# Patient Record
Sex: Female | Born: 1990 | Race: White | Hispanic: No | Marital: Married | Smoking: Never smoker
Health system: Southern US, Community
[De-identification: ages and names within clinical notes are randomized; demographics above are authoritative.]

## PROBLEM LIST (undated history)

## (undated) DIAGNOSIS — E88819 Insulin resistance, unspecified: Secondary | ICD-10-CM

## (undated) DIAGNOSIS — E669 Obesity, unspecified: Secondary | ICD-10-CM

## (undated) DIAGNOSIS — R0602 Shortness of breath: Secondary | ICD-10-CM

## (undated) DIAGNOSIS — L680 Hirsutism: Secondary | ICD-10-CM

## (undated) DIAGNOSIS — E8881 Metabolic syndrome: Secondary | ICD-10-CM

## (undated) DIAGNOSIS — M2550 Pain in unspecified joint: Secondary | ICD-10-CM

## (undated) DIAGNOSIS — E282 Polycystic ovarian syndrome: Secondary | ICD-10-CM

## (undated) DIAGNOSIS — S8991XA Unspecified injury of right lower leg, initial encounter: Secondary | ICD-10-CM

## (undated) DIAGNOSIS — E281 Androgen excess: Secondary | ICD-10-CM

## (undated) DIAGNOSIS — S83209A Unspecified tear of unspecified meniscus, current injury, unspecified knee, initial encounter: Secondary | ICD-10-CM

## (undated) DIAGNOSIS — J45909 Unspecified asthma, uncomplicated: Secondary | ICD-10-CM

## (undated) HISTORY — DX: Unspecified tear of unspecified meniscus, current injury, unspecified knee, initial encounter: S83.209A

## (undated) HISTORY — DX: Androgen excess: E28.1

## (undated) HISTORY — DX: Polycystic ovarian syndrome: E28.2

## (undated) HISTORY — DX: Shortness of breath: R06.02

## (undated) HISTORY — PX: OTHER SURGICAL HISTORY: SHX169

## (undated) HISTORY — DX: Unspecified injury of right lower leg, initial encounter: S89.91XA

## (undated) HISTORY — DX: Metabolic syndrome: E88.81

## (undated) HISTORY — DX: Insulin resistance, unspecified: E88.819

## (undated) HISTORY — DX: Obesity, unspecified: E66.9

## (undated) HISTORY — DX: Pain in unspecified joint: M25.50

## (undated) HISTORY — DX: Hirsutism: L68.0

## (undated) HISTORY — DX: Unspecified asthma, uncomplicated: J45.909

---

## 2016-02-09 HISTORY — PX: OTHER SURGICAL HISTORY: SHX169

## 2016-05-24 DIAGNOSIS — L42 Pityriasis rosea: Secondary | ICD-10-CM | POA: Diagnosis not present

## 2016-05-24 DIAGNOSIS — R635 Abnormal weight gain: Secondary | ICD-10-CM | POA: Diagnosis not present

## 2016-05-25 DIAGNOSIS — E559 Vitamin D deficiency, unspecified: Secondary | ICD-10-CM | POA: Diagnosis not present

## 2016-05-25 DIAGNOSIS — Z6839 Body mass index (BMI) 39.0-39.9, adult: Secondary | ICD-10-CM | POA: Diagnosis not present

## 2016-05-25 DIAGNOSIS — R635 Abnormal weight gain: Secondary | ICD-10-CM | POA: Diagnosis not present

## 2016-05-25 DIAGNOSIS — L818 Other specified disorders of pigmentation: Secondary | ICD-10-CM | POA: Diagnosis not present

## 2016-06-22 DIAGNOSIS — Z6838 Body mass index (BMI) 38.0-38.9, adult: Secondary | ICD-10-CM | POA: Diagnosis not present

## 2016-06-22 DIAGNOSIS — E669 Obesity, unspecified: Secondary | ICD-10-CM | POA: Diagnosis not present

## 2016-07-22 DIAGNOSIS — B360 Pityriasis versicolor: Secondary | ICD-10-CM | POA: Diagnosis not present

## 2016-08-10 DIAGNOSIS — Z6839 Body mass index (BMI) 39.0-39.9, adult: Secondary | ICD-10-CM | POA: Diagnosis not present

## 2016-08-10 DIAGNOSIS — E669 Obesity, unspecified: Secondary | ICD-10-CM | POA: Diagnosis not present

## 2016-08-10 DIAGNOSIS — M25561 Pain in right knee: Secondary | ICD-10-CM | POA: Diagnosis not present

## 2016-08-10 DIAGNOSIS — Z23 Encounter for immunization: Secondary | ICD-10-CM | POA: Diagnosis not present

## 2017-04-19 DIAGNOSIS — Z0000 Encounter for general adult medical examination without abnormal findings: Secondary | ICD-10-CM | POA: Diagnosis not present

## 2017-06-01 DIAGNOSIS — Z3040 Encounter for surveillance of contraceptives, unspecified: Secondary | ICD-10-CM | POA: Diagnosis not present

## 2017-06-01 DIAGNOSIS — Z6839 Body mass index (BMI) 39.0-39.9, adult: Secondary | ICD-10-CM | POA: Diagnosis not present

## 2017-06-01 DIAGNOSIS — Z01419 Encounter for gynecological examination (general) (routine) without abnormal findings: Secondary | ICD-10-CM | POA: Diagnosis not present

## 2017-07-05 DIAGNOSIS — Z30430 Encounter for insertion of intrauterine contraceptive device: Secondary | ICD-10-CM | POA: Diagnosis not present

## 2018-07-03 DIAGNOSIS — Z6841 Body Mass Index (BMI) 40.0 and over, adult: Secondary | ICD-10-CM | POA: Diagnosis not present

## 2018-07-03 DIAGNOSIS — Z3040 Encounter for surveillance of contraceptives, unspecified: Secondary | ICD-10-CM | POA: Diagnosis not present

## 2018-07-03 DIAGNOSIS — Z01419 Encounter for gynecological examination (general) (routine) without abnormal findings: Secondary | ICD-10-CM | POA: Diagnosis not present

## 2018-07-03 DIAGNOSIS — Z124 Encounter for screening for malignant neoplasm of cervix: Secondary | ICD-10-CM | POA: Diagnosis not present

## 2018-12-28 ENCOUNTER — Other Ambulatory Visit: Payer: Self-pay | Admitting: Occupational Medicine

## 2018-12-28 ENCOUNTER — Other Ambulatory Visit: Payer: Self-pay

## 2018-12-28 ENCOUNTER — Ambulatory Visit: Payer: Self-pay

## 2018-12-28 DIAGNOSIS — M79642 Pain in left hand: Secondary | ICD-10-CM

## 2018-12-28 MED ORDER — HYDROCODONE-ACETAMINOPHEN 5-325 MG PO TABS: 2.0000 | ORAL_TABLET | Freq: Four times a day (QID) | ORAL | 0 refills | Status: AC | PRN

## 2019-08-07 DIAGNOSIS — Z3040 Encounter for surveillance of contraceptives, unspecified: Secondary | ICD-10-CM | POA: Diagnosis not present

## 2019-08-07 DIAGNOSIS — Z01419 Encounter for gynecological examination (general) (routine) without abnormal findings: Secondary | ICD-10-CM | POA: Diagnosis not present

## 2019-08-07 DIAGNOSIS — Z6841 Body Mass Index (BMI) 40.0 and over, adult: Secondary | ICD-10-CM | POA: Diagnosis not present

## 2019-08-30 DIAGNOSIS — L918 Other hypertrophic disorders of the skin: Secondary | ICD-10-CM | POA: Diagnosis not present

## 2019-08-30 DIAGNOSIS — A630 Anogenital (venereal) warts: Secondary | ICD-10-CM | POA: Diagnosis not present

## 2020-08-06 DIAGNOSIS — R635 Abnormal weight gain: Secondary | ICD-10-CM | POA: Diagnosis not present

## 2020-08-06 DIAGNOSIS — Z3040 Encounter for surveillance of contraceptives, unspecified: Secondary | ICD-10-CM | POA: Diagnosis not present

## 2020-08-06 DIAGNOSIS — Z01419 Encounter for gynecological examination (general) (routine) without abnormal findings: Secondary | ICD-10-CM | POA: Diagnosis not present

## 2020-08-06 DIAGNOSIS — L679 Hair color and hair shaft abnormality, unspecified: Secondary | ICD-10-CM | POA: Diagnosis not present

## 2020-08-06 DIAGNOSIS — Z6841 Body Mass Index (BMI) 40.0 and over, adult: Secondary | ICD-10-CM | POA: Diagnosis not present

## 2020-08-13 ENCOUNTER — Encounter (INDEPENDENT_AMBULATORY_CARE_PROVIDER_SITE_OTHER): Payer: Self-pay

## 2020-08-19 DIAGNOSIS — E559 Vitamin D deficiency, unspecified: Secondary | ICD-10-CM | POA: Diagnosis not present

## 2020-08-19 DIAGNOSIS — E281 Androgen excess: Secondary | ICD-10-CM | POA: Diagnosis not present

## 2020-08-20 ENCOUNTER — Other Ambulatory Visit: Payer: Self-pay | Admitting: Obstetrics and Gynecology

## 2020-08-20 DIAGNOSIS — E281 Androgen excess: Secondary | ICD-10-CM

## 2020-08-20 DIAGNOSIS — E559 Vitamin D deficiency, unspecified: Secondary | ICD-10-CM | POA: Diagnosis not present

## 2020-08-29 ENCOUNTER — Ambulatory Visit
Admission: RE | Admit: 2020-08-29 | Discharge: 2020-08-29 | Disposition: A | Discharge status: Home / Self Care | Payer: BC Managed Care – PPO | Source: Ambulatory Visit | Attending: Obstetrics and Gynecology | Admitting: Obstetrics and Gynecology

## 2020-08-29 ENCOUNTER — Other Ambulatory Visit: Payer: Self-pay

## 2020-08-29 DIAGNOSIS — E281 Androgen excess: Secondary | ICD-10-CM

## 2020-08-29 DIAGNOSIS — K314 Gastric diverticulum: Secondary | ICD-10-CM | POA: Diagnosis not present

## 2020-08-29 DIAGNOSIS — R635 Abnormal weight gain: Secondary | ICD-10-CM | POA: Diagnosis not present

## 2020-08-29 MED ORDER — IOPAMIDOL (ISOVUE-300) INJECTION 61%
100.0000 mL | Freq: Once | INTRAVENOUS | Status: AC | PRN
  Administered 2020-08-29: 100 mL via INTRAVENOUS

## 2020-09-17 DIAGNOSIS — E281 Androgen excess: Secondary | ICD-10-CM | POA: Diagnosis not present

## 2020-09-23 DIAGNOSIS — E281 Androgen excess: Secondary | ICD-10-CM | POA: Diagnosis not present

## 2020-09-30 DIAGNOSIS — E559 Vitamin D deficiency, unspecified: Secondary | ICD-10-CM | POA: Diagnosis not present

## 2020-09-30 DIAGNOSIS — E669 Obesity, unspecified: Secondary | ICD-10-CM | POA: Diagnosis not present

## 2020-09-30 DIAGNOSIS — L680 Hirsutism: Secondary | ICD-10-CM | POA: Diagnosis not present

## 2020-10-06 DIAGNOSIS — L680 Hirsutism: Secondary | ICD-10-CM | POA: Diagnosis not present

## 2020-10-22 DIAGNOSIS — Z6841 Body Mass Index (BMI) 40.0 and over, adult: Secondary | ICD-10-CM | POA: Diagnosis not present

## 2020-10-22 DIAGNOSIS — N970 Female infertility associated with anovulation: Secondary | ICD-10-CM | POA: Diagnosis not present

## 2020-10-22 IMAGING — DX LEFT HAND - COMPLETE 3+ VIEW
3 series · 3 of 3 positions shown · non-contrast
Comparison: None.

CLINICAL DATA: 27-year-old female status post crush injury to the
left hand which was caught in a machine earlier today. Bruising and
mild lacerations most significant along the thumb.

EXAM:
LEFT HAND - COMPLETE 3+ VIEW

[hand pa]
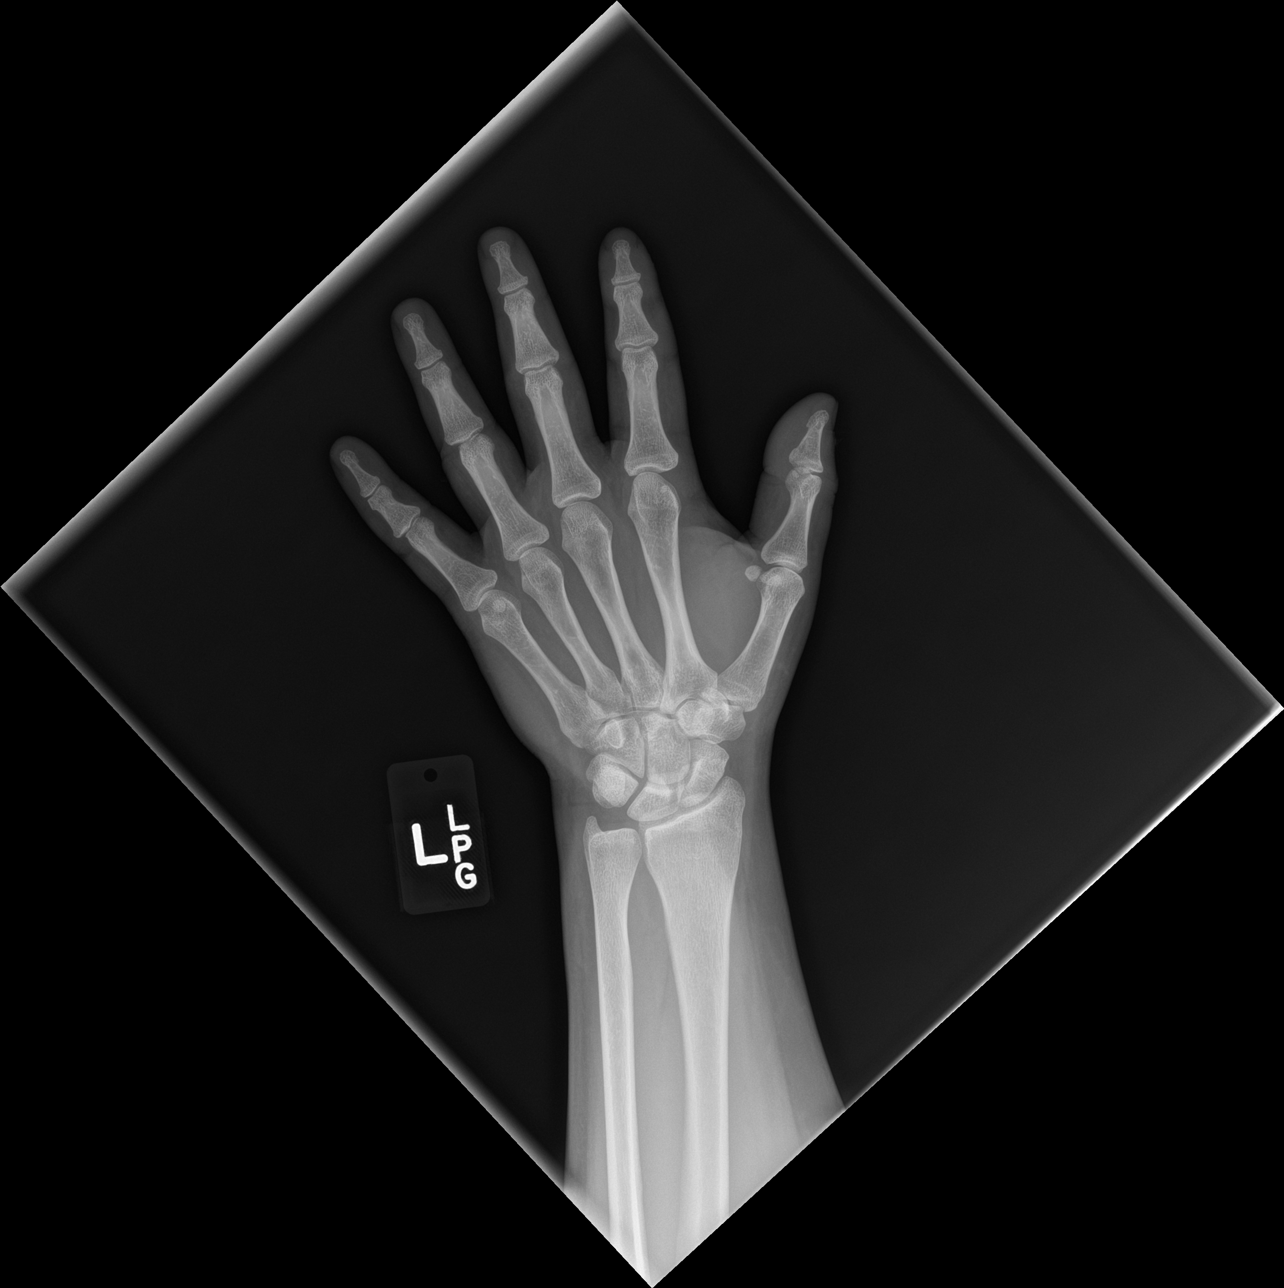

[hand obl]
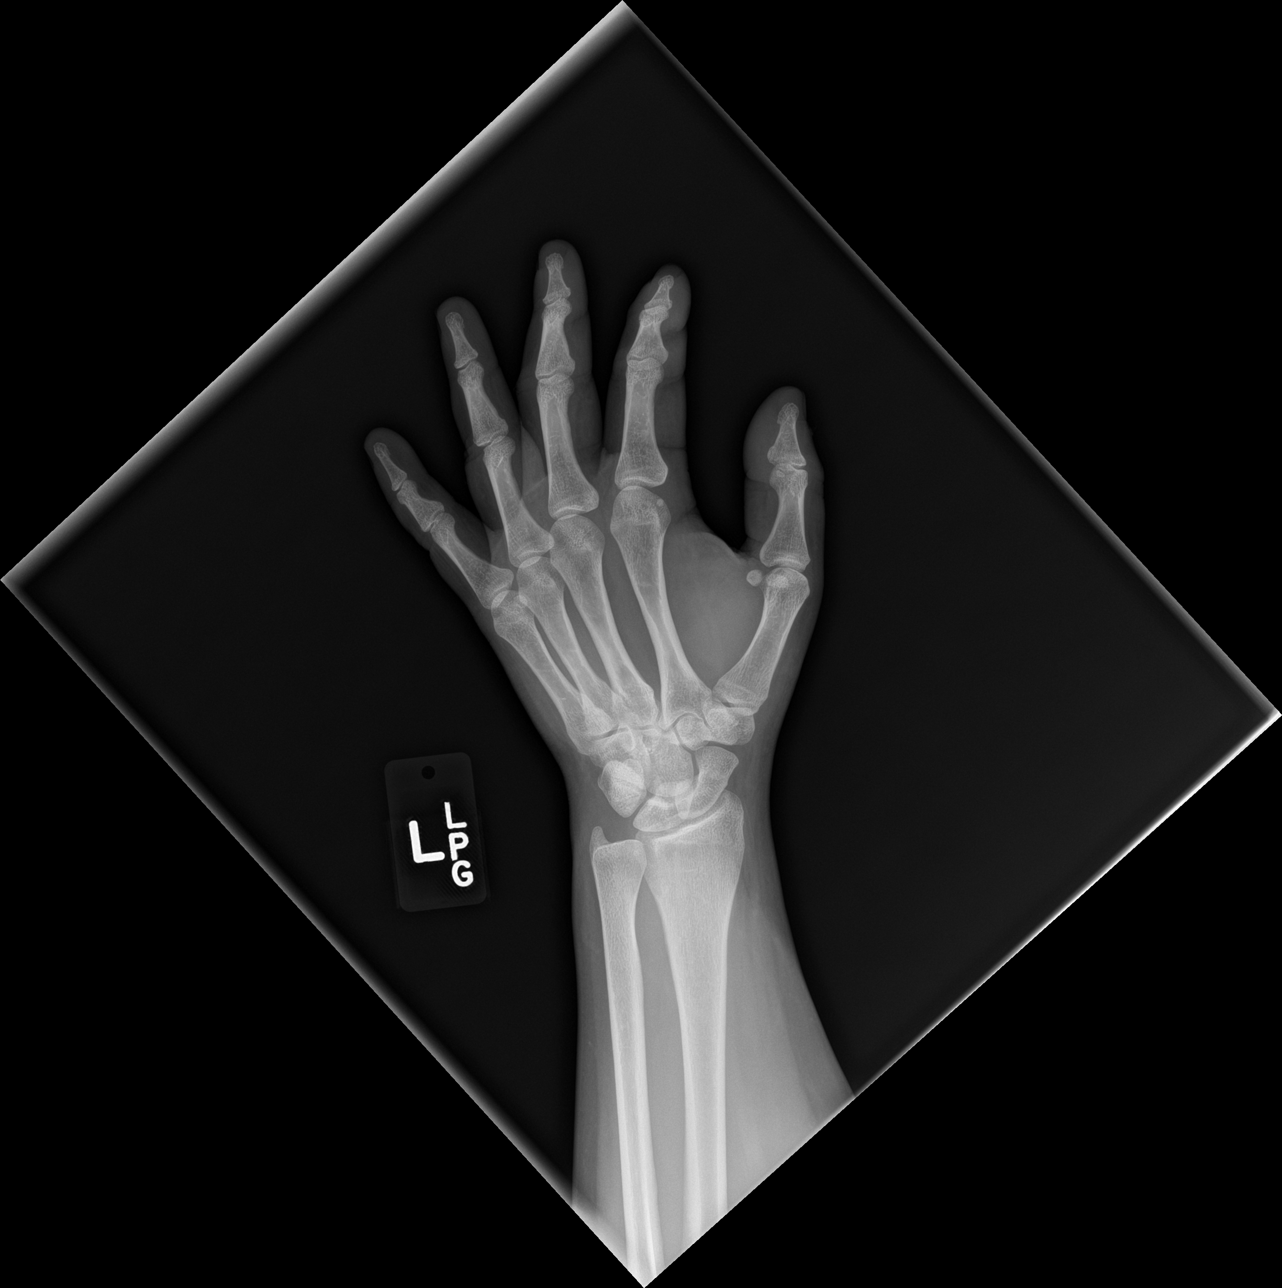

[hand lat]
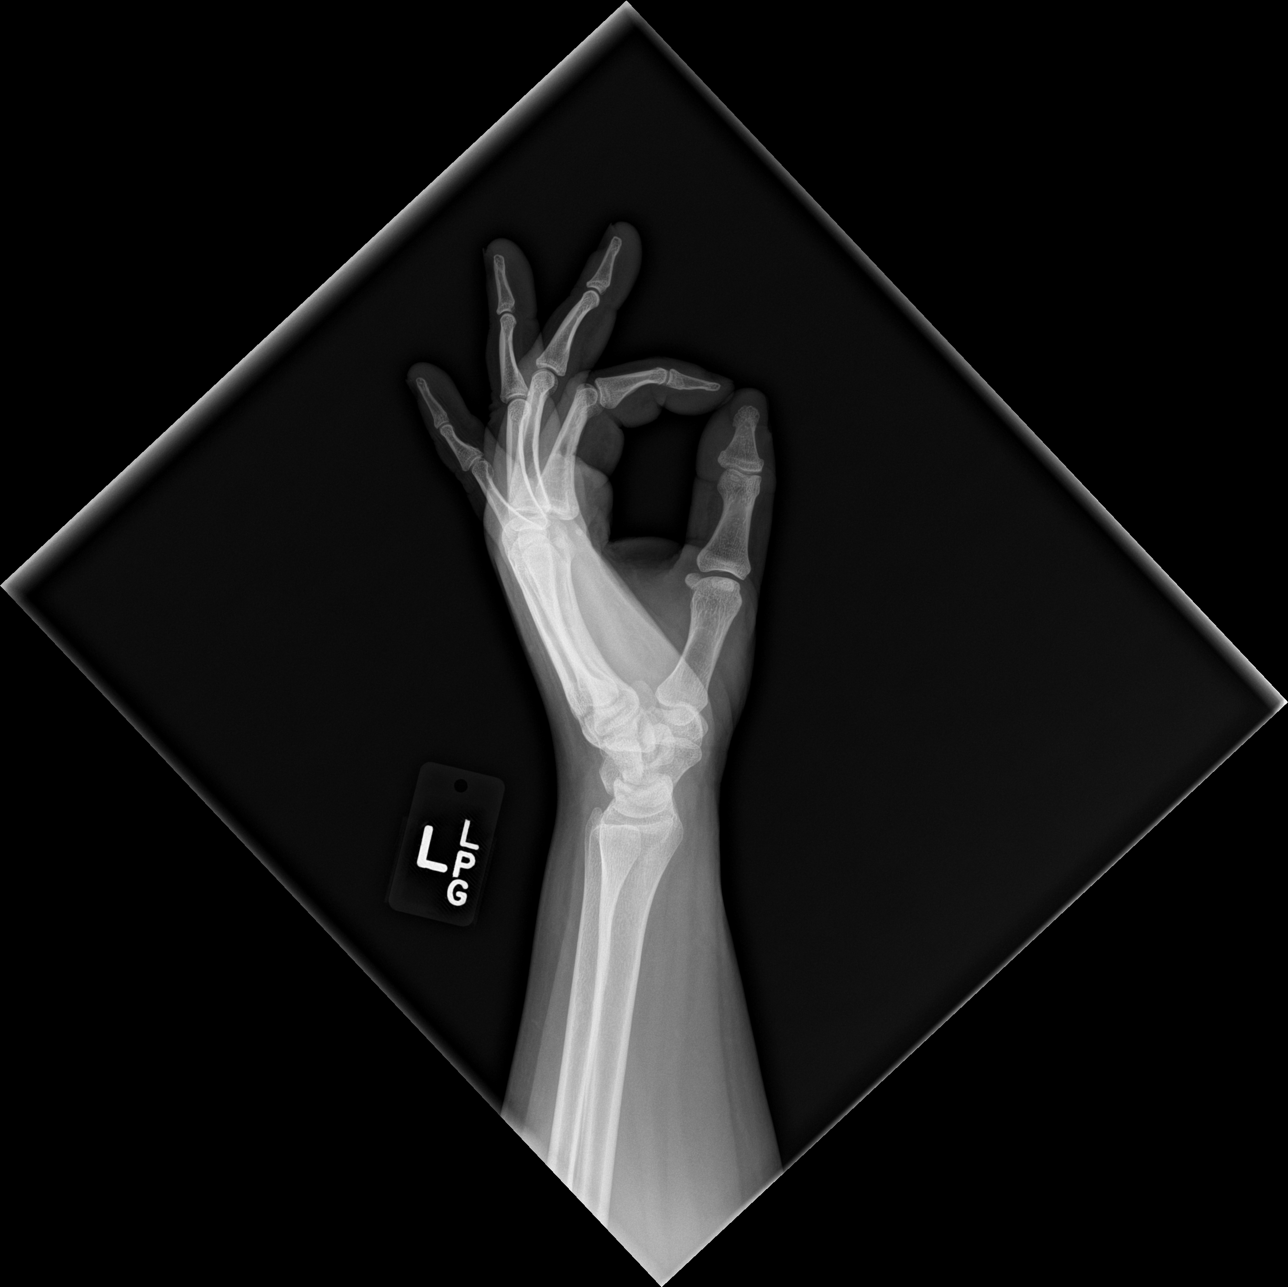

[3 of 3 positions shown; findings below may reference images not displayed]

FINDINGS: There is no evidence of fracture or dislocation. There is no
evidence of arthropathy or other focal bone abnormality. Multifocal
mild regions of soft tissue irregularity consistent with the
clinical history of bruising and superficial lacerations. No
retained radiopaque foreign body.
IMPRESSION: Negative.

## 2020-11-09 ENCOUNTER — Encounter (INDEPENDENT_AMBULATORY_CARE_PROVIDER_SITE_OTHER): Payer: Self-pay

## 2020-11-19 ENCOUNTER — Encounter (INDEPENDENT_AMBULATORY_CARE_PROVIDER_SITE_OTHER): Payer: Self-pay | Admitting: Family Medicine

## 2020-11-19 ENCOUNTER — Ambulatory Visit (INDEPENDENT_AMBULATORY_CARE_PROVIDER_SITE_OTHER): Payer: BC Managed Care – PPO | Admitting: Family Medicine

## 2020-11-19 ENCOUNTER — Other Ambulatory Visit: Payer: Self-pay

## 2020-11-19 VITALS — BP 124/75 | HR 87 | Temp 97.8°F | Ht 65.0 in | Wt 238.0 lb

## 2020-11-19 DIAGNOSIS — E8881 Metabolic syndrome: Secondary | ICD-10-CM | POA: Diagnosis not present

## 2020-11-19 DIAGNOSIS — Z6839 Body mass index (BMI) 39.0-39.9, adult: Secondary | ICD-10-CM

## 2020-11-19 DIAGNOSIS — E559 Vitamin D deficiency, unspecified: Secondary | ICD-10-CM

## 2020-11-19 DIAGNOSIS — Z1331 Encounter for screening for depression: Secondary | ICD-10-CM

## 2020-11-19 DIAGNOSIS — R0602 Shortness of breath: Secondary | ICD-10-CM

## 2020-11-19 DIAGNOSIS — R5383 Other fatigue: Secondary | ICD-10-CM | POA: Diagnosis not present

## 2020-11-19 DIAGNOSIS — Z9189 Other specified personal risk factors, not elsewhere classified: Secondary | ICD-10-CM | POA: Diagnosis not present

## 2020-11-19 DIAGNOSIS — Z0289 Encounter for other administrative examinations: Secondary | ICD-10-CM

## 2020-11-20 LAB — T3: T3, Total: 131 ng/dL (ref 71–180)

## 2020-11-20 LAB — CBC WITH DIFFERENTIAL/PLATELET
Basophils Absolute: 0.1 10*3/uL (ref 0.0–0.2)
Basos: 1 %
EOS (ABSOLUTE): 0.2 10*3/uL (ref 0.0–0.4)
Eos: 2 %
Hematocrit: 42.7 % (ref 34.0–46.6)
Hemoglobin: 13.9 g/dL (ref 11.1–15.9)
Immature Grans (Abs): 0 10*3/uL (ref 0.0–0.1)
Immature Granulocytes: 0 %
Lymphocytes Absolute: 2.6 10*3/uL (ref 0.7–3.1)
Lymphs: 24 %
MCH: 29.8 pg (ref 26.6–33.0)
MCHC: 32.6 g/dL (ref 31.5–35.7)
MCV: 91 fL (ref 79–97)
Monocytes Absolute: 0.5 10*3/uL (ref 0.1–0.9)
Monocytes: 5 %
Neutrophils Absolute: 7.3 10*3/uL — ABNORMAL HIGH (ref 1.4–7.0)
Neutrophils: 68 %
Platelets: 358 10*3/uL (ref 150–450)
RBC: 4.67 x10E6/uL (ref 3.77–5.28)
RDW: 12.5 % (ref 11.7–15.4)
WBC: 10.8 10*3/uL (ref 3.4–10.8)

## 2020-11-20 LAB — COMPREHENSIVE METABOLIC PANEL
ALT: 27 IU/L (ref 0–32)
AST: 21 IU/L (ref 0–40)
Albumin/Globulin Ratio: 2 (ref 1.2–2.2)
Albumin: 4.7 g/dL (ref 3.9–5.0)
Alkaline Phosphatase: 78 IU/L (ref 44–121)
BUN/Creatinine Ratio: 17 (ref 9–23)
BUN: 14 mg/dL (ref 6–20)
Bilirubin Total: 0.6 mg/dL (ref 0.0–1.2)
CO2: 22 mmol/L (ref 20–29)
Calcium: 10.1 mg/dL (ref 8.7–10.2)
Chloride: 105 mmol/L (ref 96–106)
Creatinine, Ser: 0.84 mg/dL (ref 0.57–1.00)
GFR calc Af Amer: 110 mL/min/{1.73_m2} (ref >59)
GFR calc non Af Amer: 96 mL/min/{1.73_m2} (ref >59)
Globulin, Total: 2.4 g/dL (ref 1.5–4.5)
Glucose: 91 mg/dL (ref 65–99)
Potassium: 5 mmol/L (ref 3.5–5.2)
Sodium: 142 mmol/L (ref 134–144)
Total Protein: 7.1 g/dL (ref 6.0–8.5)

## 2020-11-20 LAB — LIPID PANEL WITH LDL/HDL RATIO
Cholesterol, Total: 152 mg/dL (ref 100–199)
HDL: 50 mg/dL (ref >39)
LDL Chol Calc (NIH): 83 mg/dL (ref 0–99)
LDL/HDL Ratio: 1.7 ratio (ref 0.0–3.2)
Triglycerides: 102 mg/dL (ref 0–149)
VLDL Cholesterol Cal: 19 mg/dL (ref 5–40)

## 2020-11-20 LAB — HEMOGLOBIN A1C
Est. average glucose Bld gHb Est-mCnc: 105 mg/dL
Hgb A1c MFr Bld: 5.3 % (ref 4.8–5.6)

## 2020-11-20 LAB — VITAMIN B12: Vitamin B-12: 561 pg/mL (ref 232–1245)

## 2020-11-20 LAB — VITAMIN D 25 HYDROXY (VIT D DEFICIENCY, FRACTURES): Vit D, 25-Hydroxy: 53.4 ng/mL (ref 30.0–100.0)

## 2020-11-20 LAB — FOLATE: Folate: 16.7 ng/mL (ref >3.0)

## 2020-11-20 LAB — T4: T4, Total: 8.8 ug/dL (ref 4.5–12.0)

## 2020-11-20 LAB — TSH: TSH: 1.69 u[IU]/mL (ref 0.450–4.500)

## 2020-11-21 NOTE — Progress Notes (Signed)
Dear Dr. Dois Davenport Rivard,   Thank you for referring Stephanie Huerta to our clinic. The following note includes my evaluation and treatment recommendations.  Chief Complaint:   OBESITY Stephanie Huerta (MR# [id number]) is a 30 y.o. female who presents for evaluation and treatment of obesity and related comorbidities. Current BMI is Body mass index is 39.61 kg/m. Stephanie Huerta has been struggling with her weight for many years and has been unsuccessful in either losing weight, maintaining weight loss, or reaching her healthy weight goal.  Stephanie Huerta is currently in the action stage of change and ready to dedicate time achieving and maintaining a healthier weight. Stephanie Huerta is interested in becoming our patient and working on intensive lifestyle modifications including (but not limited to) diet and exercise for weight loss.  Stephanie Huerta was referred by Dr. Estanislado Pandy. She does Keto shakes in the morning around 7 AM (feels satisfied); around 10 AM is yogurt cups and 1 oz portion almonds (sometimes hungry but sometimes convenience); 3 PM- starting to get hungry and has another shake; dinner- steak or salmon (4.5 oz) and vegetables (1/3 cup).  Stephanie Huerta's habits were reviewed today and are as follows: Her family eats meals together, she thinks her family will eat healthier with her, she struggles with family and or coworkers weight loss sabotage, her desired weight loss is 58 lbs, she has been heavy most of her life, she started gaining weight 5 years ago, her heaviest weight ever was 262 pounds, she has significant food cravings issues, she skips meals frequently, she is frequently drinking liquids with calories, she frequently makes poor food choices, she frequently eats larger portions than normal, she has binge eating behaviors and she struggles with emotional eating.  Depression Screen Stephanie Huerta (modified PHQ-9) score was 18.  Depression screen PHQ 2/9 11/19/2020  Decreased Interest 3  Down, Depressed,  Hopeless 2  PHQ - 2 Score 5  Altered sleeping 2  Tired, decreased energy 3  Change in appetite 2  Feeling bad or failure about yourself  3  Trouble concentrating 1  Moving slowly or fidgety/restless 1  Suicidal thoughts 1  PHQ-9 Score 18  Difficult doing work/chores Somewhat difficult   Subjective:   1. Other fatigue Stephanie Huerta admits to daytime somnolence and admits to waking up still tired. Patent has a history of symptoms of daytime fatigue. Mylia generally gets 6.5-8 hours of sleep per night, and states that she has generally restful sleep. Snoring is present. Apneic episodes are not present. Epworth Sleepiness Score is 2. EKG normal sinus rhythm at 94 bpm  2. SOB (shortness of breath) on exertion Stephanie Huerta notes increasing shortness of breath with exercising and seems to be worsening over time with weight gain. She notes getting out of breath sooner with activity than she used to. This has gotten worse recently. Jeriyah denies shortness of breath at rest or orthopnea. EKG normal sinus rhythm at 94 bpm  3. Insulin resistance Stephanie Huerta is on Metformin. She sees Dr. Talmage Nap. Her last insulin level was 40.1 at the end of December 2021.  4. Vitamin D deficiency Pt denies nausea, vomiting, and muscle weakness but notes fatigue. Pt is on prescription Vit D. Last labs in 20's.  5. At risk for diabetes mellitus Stephanie Huerta is at higher than average risk for developing diabetes due to obesity.   Assessment/Plan:   1. Other fatigue Stephanie Huerta does feel that her weight is causing her energy to be lower than it should be. Fatigue may be related  to obesity, depression or many other causes. Labs will be ordered, and in the meanwhile, Kandise will focus on self care including making healthy food choices, increasing physical activity and focusing on stress reduction.  - EKG 12-Lead - Vitamin B12 - T3 - T4 - TSH - Folate - CBC with Differential/Platelet - Lipid Panel With LDL/HDL Ratio  2. SOB (shortness of breath) on  exertion Stephanie Huerta does feel that she gets out of breath more easily that she used to when she exercises. Michal's shortness of breath appears to be obesity related and exercise induced. She has agreed to work on weight loss and gradually increase exercise to treat her exercise induced shortness of breath. Will continue to monitor closely.  3. Insulin resistance Stephanie Huerta will continue to work on weight loss, exercise, and decreasing simple carbohydrates to help decrease the risk of diabetes. Stephanie Huerta agreed to follow-up with [place] as directed to closely monitor her progress. Check labs today.  - Comprehensive metabolic panel - Hemoglobin A1c  4. Vitamin D deficiency Low Vitamin D level contributes to fatigue and are associated with obesity, breast, and colon cancer. She agrees to continue to take prescription Vitamin D @50 ,000 IU every week and will follow-up for routine testing of Vitamin D, at least 2-3 times per year to avoid over-replacement.  - VITAMIN D 25 Hydroxy (Vit-D Deficiency, Fractures)  5. Depression screening Stephanie Huerta had a positive depression screening. Depression is commonly associated with obesity and often results in emotional eating behaviors. We will monitor this closely and work on CBT to help improve the non-hunger eating patterns. Referral to Psychology may be required if no improvement is seen as she continues in our clinic.  6. At risk for diabetes mellitus Stephanie Huerta was given approximately 15 minutes of diabetes education and counseling today. We discussed intensive lifestyle modifications today with an emphasis on weight loss as well as increasing exercise and decreasing simple carbohydrates in her diet. We also reviewed medication options with an emphasis on risk versus benefit of those discussed.   Repetitive spaced learning was employed today to elicit superior memory formation and behavioral change.  7. Class 2 severe obesity with serious comorbidity and body mass index (BMI) of 39.0 to  39.9 in adult, unspecified obesity type (HCC) Stephanie Huerta is currently in the action stage of change and her goal is to continue with weight loss efforts. I recommend Stephanie Huerta begin the structured treatment plan as follows:  She has agreed to the Category 3 Plan + 100 calories.  Exercise goals: No exercise has been prescribed at this time.   Behavioral modification strategies: increasing lean protein intake, meal planning and cooking strategies, keeping healthy foods in the home and planning for success.  She was informed of the importance of frequent follow-up visits to maximize her success with intensive lifestyle modifications for her multiple health conditions. She was informed we would discuss her lab results at her next visit unless there is a critical issue that needs to be addressed sooner. Stephanie Huerta agreed to keep her next visit at the agreed upon time to discuss these results.  Objective:   Blood pressure 124/75, pulse 87, temperature 97.8 F (36.6 C), temperature source Oral, height 5\' 5"  (1.651 m), weight 238 lb (108 kg), last menstrual period 06/11/2020, SpO2 99 %. Body mass index is 39.61 kg/m.  EKG: Normal sinus rhythm, rate 94.  Indirect Calorimeter completed today shows a VO2 of 299 and a REE of 2076.  Her calculated basal metabolic rate is 08/11/2020 thus her  basal metabolic rate is better than expected.  General: Cooperative, alert, well developed, in no acute distress. HEENT: Conjunctivae and lids unremarkable. Cardiovascular: Regular rhythm.  Lungs: Normal work of breathing. Neurologic: No focal deficits.   Lab Results  Component Value Date   CREATININE 0.84 11/19/2020   BUN 14 11/19/2020   NA 142 11/19/2020   K 5.0 11/19/2020   CL 105 11/19/2020   CO2 22 11/19/2020   Lab Results  Component Value Date   ALT 27 11/19/2020   AST 21 11/19/2020   ALKPHOS 78 11/19/2020   BILITOT 0.6 11/19/2020   Lab Results  Component Value Date   HGBA1C 5.3 11/19/2020   No results found for:  INSULIN Lab Results  Component Value Date   TSH 1.690 11/19/2020   Lab Results  Component Value Date   CHOL 152 11/19/2020   HDL 50 11/19/2020   LDLCALC 83 11/19/2020   TRIG 102 11/19/2020   Lab Results  Component Value Date   WBC 10.8 11/19/2020   HGB 13.9 11/19/2020   HCT 42.7 11/19/2020   MCV 91 11/19/2020   PLT 358 11/19/2020    Attestation Statements:   Reviewed by clinician on day of visit: allergies, medications, problem list, medical history, surgical history, family history, social history, and previous encounter notes.  Edmund Hilda, am acting as transcriptionist for Reuben Likes, MD.  This is the patient's first visit at [facility]. The patient's NEW PATIENT PACKET was reviewed at length. Included in the packet: current and past health history, medications, allergies, ROS, gynecologic history (women only), surgical history, family history, social history, weight history, weight loss surgery history (for those that have had weight loss surgery), nutritional evaluation, Huerta and food questionnaire, PHQ9, Epworth questionnaire, sleep habits questionnaire, patient life and health improvement goals questionnaire. These will all be scanned into the patient's chart under media.   During the visit, I independently reviewed the patient's EKG, bioimpedance scale results, and indirect calorimeter results. I used this information to tailor a meal plan for the patient that will help her to lose weight and will improve her obesity-related conditions going forward. I performed a medically necessary appropriate examination and/or evaluation. I discussed the assessment and treatment plan with the patient. The patient was provided an opportunity to ask questions and all were answered. The patient agreed with the plan and demonstrated an understanding of the instructions. Labs were ordered at this visit and will be reviewed at the next visit unless more critical  results need to be addressed immediately. Clinical information was updated and documented in the EMR.   Time spent on visit including pre-visit chart review and post-visit care was 45 minutes.   A separate 15 minutes was spent on risk counseling (see above).  I have reviewed the above documentation for accuracy and completeness, and I agree with the above. - Katherina Mires, MD

## 2020-12-03 ENCOUNTER — Ambulatory Visit (INDEPENDENT_AMBULATORY_CARE_PROVIDER_SITE_OTHER): Payer: BC Managed Care – PPO | Admitting: Family Medicine

## 2020-12-03 ENCOUNTER — Other Ambulatory Visit: Payer: Self-pay

## 2020-12-03 ENCOUNTER — Encounter (INDEPENDENT_AMBULATORY_CARE_PROVIDER_SITE_OTHER): Payer: Self-pay | Admitting: Family Medicine

## 2020-12-03 VITALS — BP 108/75 | HR 92 | Temp 97.9°F | Ht 65.0 in | Wt 236.0 lb

## 2020-12-03 DIAGNOSIS — E8881 Metabolic syndrome: Secondary | ICD-10-CM

## 2020-12-03 DIAGNOSIS — Z6839 Body mass index (BMI) 39.0-39.9, adult: Secondary | ICD-10-CM

## 2020-12-03 DIAGNOSIS — E559 Vitamin D deficiency, unspecified: Secondary | ICD-10-CM | POA: Diagnosis not present

## 2020-12-04 NOTE — Progress Notes (Signed)
Chief Complaint:   OBESITY Stephanie Huerta is here to discuss her progress with her obesity treatment plan along with follow-up of her obesity related diagnoses. Stephanie Huerta is on the Category 3 Plan and states she is following her eating plan approximately 75% of the time. Stephanie Huerta states she is jiu jitsu  90 minutes 2 times per week.  Today's visit was #: 2 Starting weight: 238 lbs Starting date: 11/19/2020 Today's weight: 236 lbs Today's date: 12/03/2020 Total lbs lost to date: 2 lbs Total lbs lost since last in-office visit: 2 lbs  Interim History: Stephanie Huerta is working on getting all food in on meal plan. She denies hunger. For snacks, she is doing pretzels, almonds, and yogurt. She denies cravings. Stephanie Huerta is going out of town for a week in the next few weeks and is hopeful to make food in cabin and limit eating out.  Subjective:   1. Insulin resistance Stephanie Huerta's last insulin level was 40.1 in 09/2020. She is on Metformin with breakfast daily. She denies GI side effects.  2. Vitamin D deficiency Stephanie Huerta's Vit D level is 53.9. She is on prescription Vit D weekly.  Assessment/Plan:   1. Insulin resistance Stephanie Huerta will continue to work on weight loss, exercise, and decreasing simple carbohydrates to help decrease the risk of diabetes. Stephanie Huerta agreed to follow-up with [place] as directed to closely monitor her progress. Continue Metformin. Repeat labs in 3 months.  2. Vitamin D deficiency Low Vitamin D level contributes to fatigue and are associated with obesity, breast, and colon cancer. She agrees to change to take prescription Vitamin D @50 ,000 IU every other week and will follow-up for routine testing of Vitamin D, at least 2-3 times per year to avoid over-replacement.  3. Class 2 severe obesity with serious comorbidity and body mass index (BMI) of 39.0 to 39.9 in adult, unspecified obesity type (HCC) Stephanie Huerta is currently in the action stage of change. As such, her goal is to continue with weight loss efforts. She has  agreed to the Category 3 Plan.   Exercise goals: All adults should avoid inactivity. Some physical activity is better than none, and adults who participate in any amount of physical activity gain some health benefits.  Behavioral modification strategies: increasing lean protein intake, meal planning and cooking strategies, keeping healthy foods in the home and planning for success.  Stephanie Huerta has agreed to follow-up with our clinic in 2 weeks. She was informed of the importance of frequent follow-up visits to maximize her success with intensive lifestyle modifications for her multiple health conditions.   Objective:   Blood pressure 108/75, pulse 92, temperature 97.9 F (36.6 C), temperature source Oral, height 5\' 5"  (1.651 m), weight 236 lb (107 kg), last menstrual period 11/12/2020, SpO2 97 %. Body mass index is 39.27 kg/m.  General: Cooperative, alert, well developed, in no acute distress. HEENT: Conjunctivae and lids unremarkable. Cardiovascular: Regular rhythm.  Lungs: Normal work of breathing. Neurologic: No focal deficits.   Lab Results  Component Value Date   CREATININE 0.84 11/19/2020   BUN 14 11/19/2020   NA 142 11/19/2020   K 5.0 11/19/2020   CL 105 11/19/2020   CO2 22 11/19/2020   Lab Results  Component Value Date   ALT 27 11/19/2020   AST 21 11/19/2020   ALKPHOS 78 11/19/2020   BILITOT 0.6 11/19/2020   Lab Results  Component Value Date   HGBA1C 5.3 11/19/2020   No results found for: INSULIN Lab Results  Component Value Date  TSH 1.690 11/19/2020   Lab Results  Component Value Date   CHOL 152 11/19/2020   HDL 50 11/19/2020   LDLCALC 83 11/19/2020   TRIG 102 11/19/2020   Lab Results  Component Value Date   WBC 10.8 11/19/2020   HGB 13.9 11/19/2020   HCT 42.7 11/19/2020   MCV 91 11/19/2020   PLT 358 11/19/2020     Attestation Statements:   Reviewed by clinician on day of visit: allergies, medications, problem list, medical history, surgical  history, family history, social history, and previous encounter notes.  Time spent on visit including pre-visit chart review and post-visit care and charting was 25 minutes.   Edmund Hilda, am acting as transcriptionist for Reuben Likes, MD.   I have reviewed the above documentation for accuracy and completeness, and I agree with the above. - Katherina Mires, MD

## 2020-12-24 ENCOUNTER — Ambulatory Visit (INDEPENDENT_AMBULATORY_CARE_PROVIDER_SITE_OTHER): Payer: BC Managed Care – PPO | Admitting: Family Medicine

## 2020-12-24 ENCOUNTER — Other Ambulatory Visit: Payer: Self-pay

## 2020-12-24 ENCOUNTER — Encounter (INDEPENDENT_AMBULATORY_CARE_PROVIDER_SITE_OTHER): Payer: Self-pay | Admitting: Family Medicine

## 2020-12-24 VITALS — BP 115/82 | HR 90 | Temp 97.9°F | Ht 65.0 in | Wt 236.0 lb

## 2020-12-24 DIAGNOSIS — Z6839 Body mass index (BMI) 39.0-39.9, adult: Secondary | ICD-10-CM

## 2020-12-24 DIAGNOSIS — E8881 Metabolic syndrome: Secondary | ICD-10-CM

## 2020-12-24 DIAGNOSIS — E559 Vitamin D deficiency, unspecified: Secondary | ICD-10-CM

## 2020-12-29 NOTE — Progress Notes (Signed)
Chief Complaint:   OBESITY Stephanie Huerta is here to discuss her progress with her obesity treatment plan along with follow-up of her obesity related diagnoses. Jamesa is on the Category 3 Plan and states she is following her eating plan approximately 60-65% of the time. Stephanie Huerta states she is Jiu Jitsu 90 minutes 2 times per week.  Today's visit was #: 3 Starting weight: 238 lbs Starting date: 11/19/2020 Today's weight: 236 lbs Today's date: 12/24/2020 Total lbs lost to date: 2 lbs Total lbs lost since last in-office visit: 0  Interim History: Vacation through meal plan off a bit. Stephanie Huerta voices she had to get to the store to restock and now she is restocked and back on track. She doesn't have much planned for the next few weeks. She has an upcoming appointment with Dr. Talmage Nap and Dr. Estanislado Pandy. She has no obstacles in the next few weeks. No change needed . She is doing almonds, yogurt, and pretzels for snacks. She is doing 6-7 oz protein at dinner with 1-2 cups of vegetables.   Subjective:   1. Vitamin D deficiency Stephanie Huerta denies nausea, vomiting, and muscle weakness but notes fatigue. She is on prescription Vit D.  2. Insulin resistance Stephanie Huerta is on Metformin. She has a history of PCOS.   Assessment/Plan:   1. Vitamin D deficiency Low Vitamin D level contributes to fatigue and are associated with obesity, breast, and colon cancer. She agrees to continue to take prescription Vitamin D @50 ,000 IU every week and will follow-up for routine testing of Vitamin D, at least 2-3 times per year to avoid over-replacement. No refill needed at this time.  2. Insulin resistance Stephanie Huerta will continue to work on weight loss, exercise, and decreasing simple carbohydrates to help decrease the risk of diabetes. Stephanie Huerta agreed to follow-up with [facility] as directed to closely monitor her progress. Continue Metformin. No change in dose.  3. Class 2 severe obesity with serious comorbidity and body mass index (BMI) of 39.0 to 39.9 in  adult, unspecified obesity type (HCC) Stephanie Huerta is currently in the action stage of change. As such, her goal is to continue with weight loss efforts. She has agreed to the Category 3 Plan.   Exercise goals: No exercise has been prescribed at this time.  Behavioral modification strategies: increasing lean protein intake, meal planning and cooking strategies, keeping healthy foods in the home and planning for success.  Stephanie Huerta has agreed to follow-up with our clinic in 2 weeks. She was informed of the importance of frequent follow-up visits to maximize her success with intensive lifestyle modifications for her multiple health conditions.   Objective:   Blood pressure 115/82, pulse 90, temperature 97.9 F (36.6 C), temperature source Oral, height 5\' 5"  (1.651 m), weight 236 lb (107 kg), SpO2 99 %. Body mass index is 39.27 kg/m.  General: Cooperative, alert, well developed, in no acute distress. HEENT: Conjunctivae and lids unremarkable. Cardiovascular: Regular rhythm.  Lungs: Normal work of breathing. Neurologic: No focal deficits.   Lab Results  Component Value Date   CREATININE 0.84 11/19/2020   BUN 14 11/19/2020   NA 142 11/19/2020   K 5.0 11/19/2020   CL 105 11/19/2020   CO2 22 11/19/2020   Lab Results  Component Value Date   ALT 27 11/19/2020   AST 21 11/19/2020   ALKPHOS 78 11/19/2020   BILITOT 0.6 11/19/2020   Lab Results  Component Value Date   HGBA1C 5.3 11/19/2020   No results found for: INSULIN Lab  Results  Component Value Date   TSH 1.690 11/19/2020   Lab Results  Component Value Date   CHOL 152 11/19/2020   HDL 50 11/19/2020   LDLCALC 83 11/19/2020   TRIG 102 11/19/2020   Lab Results  Component Value Date   WBC 10.8 11/19/2020   HGB 13.9 11/19/2020   HCT 42.7 11/19/2020   MCV 91 11/19/2020   PLT 358 11/19/2020    Attestation Statements:   Reviewed by clinician on day of visit: allergies, medications, problem list, medical history, surgical history,  family history, social history, and previous encounter notes.  Time spent on visit including pre-visit chart review and post-visit care and charting was 15 minutes.   Edmund Hilda, am acting as transcriptionist for Reuben Likes, MD.   I have reviewed the above documentation for accuracy and completeness, and I agree with the above. - Katherina Mires, MD

## 2021-01-07 ENCOUNTER — Encounter (INDEPENDENT_AMBULATORY_CARE_PROVIDER_SITE_OTHER): Payer: Self-pay | Admitting: Family Medicine

## 2021-01-07 ENCOUNTER — Other Ambulatory Visit: Payer: Self-pay

## 2021-01-07 ENCOUNTER — Ambulatory Visit (INDEPENDENT_AMBULATORY_CARE_PROVIDER_SITE_OTHER): Payer: BC Managed Care – PPO | Admitting: Family Medicine

## 2021-01-07 VITALS — BP 112/74 | HR 79 | Temp 98.4°F | Ht 65.0 in | Wt 235.0 lb

## 2021-01-07 DIAGNOSIS — Z6839 Body mass index (BMI) 39.0-39.9, adult: Secondary | ICD-10-CM

## 2021-01-07 DIAGNOSIS — E559 Vitamin D deficiency, unspecified: Secondary | ICD-10-CM | POA: Diagnosis not present

## 2021-01-07 DIAGNOSIS — E8881 Metabolic syndrome: Secondary | ICD-10-CM

## 2021-01-09 DIAGNOSIS — E8881 Metabolic syndrome: Secondary | ICD-10-CM | POA: Diagnosis not present

## 2021-01-09 DIAGNOSIS — L680 Hirsutism: Secondary | ICD-10-CM | POA: Diagnosis not present

## 2021-01-09 DIAGNOSIS — E669 Obesity, unspecified: Secondary | ICD-10-CM | POA: Diagnosis not present

## 2021-01-12 DIAGNOSIS — Z30432 Encounter for removal of intrauterine contraceptive device: Secondary | ICD-10-CM | POA: Diagnosis not present

## 2021-01-14 NOTE — Progress Notes (Signed)
Chief Complaint:   OBESITY Avia is here to discuss her progress with her obesity treatment plan along with follow-up of her obesity related diagnoses. Aadvika is on the Category 3 Plan and states she is following her eating plan approximately 70-75% of the time. Jamilya states she is doing [place] Jitsu 90 minutes 2 times per week.  Today's visit was #: 4 Starting weight: 238 lbs Starting date: 11/19/2020 Today's weight: 235 lbs Today's date: 01/07/2021 Total lbs lost to date: 3 Total lbs lost since last in-office visit: 1  Interim History: Jalisha is finding that if she doesn't sit and eat at least 1/2 of her meal at work, she does tend to gravitate to [employer]. She has started meal planning and prepping. Her hardest meal is breakfast. She doesn't always eat everything she is supposed to, particularly for breakfast. She denies cravings. She has no plans for the next few weeks.  Subjective:   1. Vitamin D deficiency Pt denies nausea, vomiting, and muscle weakness but notes fatigue. Pt is on OTC Vit D daily.  Assessment/Plan:   1. Vitamin D deficiency Low Vitamin D level contributes to fatigue and are associated with obesity, breast, and colon cancer. She agrees to continue to take OTC Vitamin D daily and will follow-up for routine testing of Vitamin D, at least 2-3 times per year to avoid over-replacement.  2. Class 2 severe obesity with serious comorbidity and body mass index (BMI) of 39.0 to 39.9 in adult, unspecified obesity type (HCC) Esteen is currently in the action stage of change. As such, her goal is to continue with weight loss efforts. She has agreed to the Category 3 Plan.   Exercise goals:  As is  Behavioral modification strategies: increasing lean protein intake, meal planning and cooking strategies and keeping healthy foods in the home.  Yvana has agreed to follow-up with our clinic in 3 weeks. She was informed of the importance of frequent follow-up visits to maximize  her success with intensive lifestyle modifications for her multiple health conditions.   Objective:   Blood pressure 112/74, pulse 79, temperature 98.4 F (36.9 C), temperature source Oral, height 5\' 5"  (1.651 m), weight 235 lb (106.6 kg), SpO2 99 %. Body mass index is 39.11 kg/m.  General: Cooperative, alert, well developed, in no acute distress. HEENT: Conjunctivae and lids unremarkable. Cardiovascular: Regular rhythm.  Lungs: Normal work of breathing. Neurologic: No focal deficits.   Lab Results  Component Value Date   CREATININE 0.84 11/19/2020   BUN 14 11/19/2020   NA 142 11/19/2020   K 5.0 11/19/2020   CL 105 11/19/2020   CO2 22 11/19/2020   Lab Results  Component Value Date   ALT 27 11/19/2020   AST 21 11/19/2020   ALKPHOS 78 11/19/2020   BILITOT 0.6 11/19/2020   Lab Results  Component Value Date   HGBA1C 5.3 11/19/2020   No results found for: INSULIN Lab Results  Component Value Date   TSH 1.690 11/19/2020   Lab Results  Component Value Date   CHOL 152 11/19/2020   HDL 50 11/19/2020   LDLCALC 83 11/19/2020   TRIG 102 11/19/2020   Lab Results  Component Value Date   WBC 10.8 11/19/2020   HGB 13.9 11/19/2020   HCT 42.7 11/19/2020   MCV 91 11/19/2020   PLT 358 11/19/2020   No results found for: IRON, TIBC, FERRITIN   Attestation Statements:   Reviewed by clinician on day of visit: allergies, medications,  problem list, medical history, surgical history, family history, social history, and previous encounter notes.  Time spent on visit including pre-visit chart review and post-visit care and charting was 12 minutes.   Edmund Hilda, am acting as transcriptionist for Reuben Likes, MD.   I have reviewed the above documentation for accuracy and completeness, and I agree with the above. - Katherina Mires, MD

## 2021-01-16 DIAGNOSIS — E559 Vitamin D deficiency, unspecified: Secondary | ICD-10-CM | POA: Diagnosis not present

## 2021-01-16 DIAGNOSIS — L680 Hirsutism: Secondary | ICD-10-CM | POA: Diagnosis not present

## 2021-01-16 DIAGNOSIS — E8881 Metabolic syndrome: Secondary | ICD-10-CM | POA: Diagnosis not present

## 2021-01-16 DIAGNOSIS — E669 Obesity, unspecified: Secondary | ICD-10-CM | POA: Diagnosis not present

## 2021-01-27 ENCOUNTER — Encounter (INDEPENDENT_AMBULATORY_CARE_PROVIDER_SITE_OTHER): Payer: Self-pay | Admitting: Family Medicine

## 2021-01-27 ENCOUNTER — Ambulatory Visit (INDEPENDENT_AMBULATORY_CARE_PROVIDER_SITE_OTHER): Payer: BC Managed Care – PPO | Admitting: Family Medicine

## 2021-01-27 ENCOUNTER — Other Ambulatory Visit: Payer: Self-pay

## 2021-01-27 VITALS — BP 109/72 | HR 76 | Temp 97.7°F | Ht 65.0 in | Wt 237.0 lb

## 2021-01-27 DIAGNOSIS — E8881 Metabolic syndrome: Secondary | ICD-10-CM | POA: Diagnosis not present

## 2021-01-27 DIAGNOSIS — E559 Vitamin D deficiency, unspecified: Secondary | ICD-10-CM

## 2021-01-27 DIAGNOSIS — Z6839 Body mass index (BMI) 39.0-39.9, adult: Secondary | ICD-10-CM | POA: Diagnosis not present

## 2021-01-28 NOTE — Progress Notes (Signed)
Chief Complaint:   OBESITY Stephanie Huerta is here to discuss her progress with her obesity treatment plan along with follow-up of her obesity related diagnoses. Bobbie is on the Category 3 Plan and states she is following her eating plan approximately 80% of the time. Kathia states she is Jiu Jitsu  90 minutes 2 times per week.  Today's visit was #: 5 Starting weight: 238 lbs Starting date: 11/19/2020 Today's weight: 237 lbs Today's date: 01/27/2021 Total lbs lost to date: 1 Total lbs lost since last in-office visit: 0  Interim History: Taylore had increased Metformin to 500 mg BID and has had no issues. She reports work has changed from starting at [employer] to starting at [employer] (10 hour days for overtime). She has been skipping breakfast and then grabbing extra snacks that are from vending machine. She has no plans for the next few weeks.  Subjective:   1. Insulin resistance [name] is not on Metformin BID. She reports no GI side effects.  2. Vitamin D deficiency Tomorrow denies nausea, vomiting, and muscle weakness but notes fatigue. Pt is on OTC Vit D 2,000 IU daily.  Assessment/Plan:   1. Insulin resistance [name] will continue to work on weight loss, exercise, and decreasing simple carbohydrates to help decrease the risk of diabetes. [name] agreed to follow-up with [place] as directed to closely monitor her progress. Continue Metformin. No refill needed today.  2. Vitamin D deficiency Low Vitamin D level contributes to fatigue and are associated with obesity, breast, and colon cancer. She agrees to continue to take OTC Vitamin D @2 ,000 IU daily and will follow-up for routine testing of Vitamin D, at least 2-3 times per year to avoid over-replacement.  3. Class 2 severe obesity with serious comorbidity and body mass index (BMI) of 39.0 to 39.9 in adult, unspecified obesity type (HCC) [name] is currently in the action stage of change. As such, her goal is to continue with weight loss efforts. She has agreed to the  Category 3 Plan.   Exercise goals: As is  Behavioral modification strategies: increasing lean protein intake, no skipping meals and meal planning and cooking strategies.  [name] has agreed to follow-up with our clinic in 2-3 weeks. She was informed of the importance of frequent follow-up visits to maximize her success with intensive lifestyle modifications for her multiple health conditions.   Objective:   Blood pressure 109/72, pulse 76, temperature 97.7 F (36.5 C), height 5\' 5"  (1.651 m), weight 237 lb (107.5 kg), SpO2 100 %. Body mass index is 39.44 kg/m.  General: Cooperative, alert, well developed, in no acute distress. HEENT: Conjunctivae and lids unremarkable. Cardiovascular: Regular rhythm.  Lungs: Normal work of breathing. Neurologic: No focal deficits.   Lab Results  Component Value Date   CREATININE 0.84 11/19/2020   BUN 14 11/19/2020   NA 142 11/19/2020   K 5.0 11/19/2020   CL 105 11/19/2020   CO2 22 11/19/2020   Lab Results  Component Value Date   ALT 27 11/19/2020   AST 21 11/19/2020   ALKPHOS 78 11/19/2020   BILITOT 0.6 11/19/2020   Lab Results  Component Value Date   HGBA1C 5.3 11/19/2020   No results found for: INSULIN Lab Results  Component Value Date   TSH 1.690 11/19/2020   Lab Results  Component Value Date   CHOL 152 11/19/2020   HDL 50 11/19/2020   LDLCALC 83 11/19/2020   TRIG 102 11/19/2020   Lab Results  Component Value Date  WBC 10.8 11/19/2020   HGB 13.9 11/19/2020   HCT 42.7 11/19/2020   MCV 91 11/19/2020   PLT 358 11/19/2020   No results found for: IRON, TIBC, FERRITIN   Attestation Statements:   Reviewed by clinician on day of visit: allergies, medications, problem list, medical history, surgical history, family history, social history, and previous encounter notes.  Time spent on visit including pre-visit chart review and post-visit care and charting was 15 minutes.   Edmund Hilda, am acting as transcriptionist  for Reuben Likes, MD.  I have reviewed the above documentation for accuracy and completeness, and I agree with the above. - Katherina Mires, MD

## 2021-02-03 DIAGNOSIS — N970 Female infertility associated with anovulation: Secondary | ICD-10-CM | POA: Diagnosis not present

## 2021-02-16 DIAGNOSIS — N970 Female infertility associated with anovulation: Secondary | ICD-10-CM | POA: Diagnosis not present

## 2021-02-23 ENCOUNTER — Other Ambulatory Visit: Payer: Self-pay

## 2021-02-23 ENCOUNTER — Encounter (INDEPENDENT_AMBULATORY_CARE_PROVIDER_SITE_OTHER): Payer: Self-pay | Admitting: Family Medicine

## 2021-02-23 ENCOUNTER — Ambulatory Visit (INDEPENDENT_AMBULATORY_CARE_PROVIDER_SITE_OTHER): Payer: BC Managed Care – PPO | Admitting: Family Medicine

## 2021-02-23 VITALS — BP 106/72 | HR 71 | Temp 97.7°F | Ht 65.0 in | Wt 237.0 lb

## 2021-02-23 DIAGNOSIS — Z6839 Body mass index (BMI) 39.0-39.9, adult: Secondary | ICD-10-CM | POA: Diagnosis not present

## 2021-02-23 DIAGNOSIS — E8881 Metabolic syndrome: Secondary | ICD-10-CM | POA: Diagnosis not present

## 2021-02-23 DIAGNOSIS — E559 Vitamin D deficiency, unspecified: Secondary | ICD-10-CM | POA: Diagnosis not present

## 2021-02-24 NOTE — Progress Notes (Signed)
Chief Complaint:   OBESITY Stephanie Huerta is here to discuss her progress with her obesity treatment plan along with follow-up of her obesity related diagnoses. Stephanie Huerta is on the Category 3 Plan and states she is following her eating plan approximately 65-70% of the time. Stephanie Huerta states she is doing [place] Jitsu 90 minutes 3 times per week.  Today's visit was #: 6 Starting weight: 238 lbs Starting date: 11/19/2020 Today's weight: 237 lbs Today's date: 02/23/2021 Total lbs lost to date: 1 Total lbs lost since last in-office visit: 0  Interim History: School is out for the summer and Stephanie Huerta voices this has given her more time for [employer]. She was also placed on letrozole by Dr. Estanislado Pandy. Food wise, she's made some indulgent choices over the weekend. Pt is not sure what she wants to do the next few weeks. She mentions that she has had some indulgent eating, then struggles to get back on track after that.  Subjective:   1. Vitamin D deficiency Pt denies nausea, vomiting, and muscle weakness but notes fatigue. Pt is on OTC Vit D 2K IU/day.  2. Insulin resistance Pt is on Metformin. She denies GI side effects.  Assessment/Plan:   1. Vitamin D deficiency Low Vitamin D level contributes to fatigue and are associated with obesity, breast, and colon cancer. She agrees to continue to take OTC Vitamin D @2 ,000 IU daily and will follow-up for routine testing of Vitamin D, at least 2-3 times per year to avoid over-replacement.  2. Insulin resistance Stephanie Huerta will continue to work on weight loss, exercise, and decreasing simple carbohydrates to help decrease the risk of diabetes. Stephanie Huerta agreed to follow-up with Denny Peon as directed to closely monitor her progress. -Continue Metformin. No refill needed today.  3. Class 2 severe obesity with serious comorbidity and body mass index (BMI) of 39.0 to 39.9 in adult, unspecified obesity type (HCC)  Stephanie Huerta is currently in the action stage of change. As such, her goal is to continue  with weight loss efforts. She has agreed to the Category 3 Plan.   Exercise goals: As is  Behavioral modification strategies: increasing lean protein intake, meal planning and cooking strategies and keeping healthy foods in the home.  Stephanie Huerta has agreed to follow-up with our clinic in 3 weeks. She was informed of the importance of frequent follow-up visits to maximize her success with intensive lifestyle modifications for her multiple health conditions.   Objective:   Blood pressure 106/72, pulse 71, temperature 97.7 F (36.5 C), height 5\' 5"  (1.651 m), weight 237 lb (107.5 kg), SpO2 99 %. Body mass index is 39.44 kg/m.  General: Cooperative, alert, well developed, in no acute distress. HEENT: Conjunctivae and lids unremarkable. Cardiovascular: Regular rhythm.  Lungs: Normal work of breathing. Neurologic: No focal deficits.   Lab Results  Component Value Date   CREATININE 0.84 11/19/2020   BUN 14 11/19/2020   NA 142 11/19/2020   K 5.0 11/19/2020   CL 105 11/19/2020   CO2 22 11/19/2020   Lab Results  Component Value Date   ALT 27 11/19/2020   AST 21 11/19/2020   ALKPHOS 78 11/19/2020   BILITOT 0.6 11/19/2020   Lab Results  Component Value Date   HGBA1C 5.3 11/19/2020   No results found for: INSULIN Lab Results  Component Value Date   TSH 1.690 11/19/2020   Lab Results  Component Value Date   CHOL 152 11/19/2020   HDL 50 11/19/2020   LDLCALC 83 11/19/2020  TRIG 102 11/19/2020   Lab Results  Component Value Date   WBC 10.8 11/19/2020   HGB 13.9 11/19/2020   HCT 42.7 11/19/2020   MCV 91 11/19/2020   PLT 358 11/19/2020   No results found for: IRON, TIBC, FERRITIN   Attestation Statements:   Reviewed by clinician on day of visit: allergies, medications, problem list, medical history, surgical history, family history, social history, and previous encounter notes.  Time spent on visit including pre-visit chart review and post-visit care and charting was 15  minutes.   Edmund Hilda, CMA, am acting as transcriptionist for Reuben Likes, MD.   I have reviewed the above documentation for accuracy and completeness, and I agree with the above. - Katherina Mires, MD

## 2021-03-06 DIAGNOSIS — N979 Female infertility, unspecified: Secondary | ICD-10-CM | POA: Diagnosis not present

## 2021-03-11 ENCOUNTER — Encounter (INDEPENDENT_AMBULATORY_CARE_PROVIDER_SITE_OTHER): Payer: Self-pay

## 2021-03-11 ENCOUNTER — Encounter (INDEPENDENT_AMBULATORY_CARE_PROVIDER_SITE_OTHER): Payer: Self-pay | Admitting: Family Medicine

## 2021-03-17 DIAGNOSIS — N970 Female infertility associated with anovulation: Secondary | ICD-10-CM | POA: Diagnosis not present

## 2021-03-19 ENCOUNTER — Ambulatory Visit (INDEPENDENT_AMBULATORY_CARE_PROVIDER_SITE_OTHER): Payer: BC Managed Care – PPO | Admitting: Family Medicine

## 2021-03-25 ENCOUNTER — Ambulatory Visit (INDEPENDENT_AMBULATORY_CARE_PROVIDER_SITE_OTHER): Payer: BC Managed Care – PPO | Admitting: Family Medicine

## 2021-03-25 ENCOUNTER — Other Ambulatory Visit: Payer: Self-pay

## 2021-03-25 VITALS — BP 113/71 | HR 83 | Temp 97.8°F | Ht 65.0 in | Wt 231.0 lb

## 2021-03-25 DIAGNOSIS — Z6839 Body mass index (BMI) 39.0-39.9, adult: Secondary | ICD-10-CM

## 2021-03-25 DIAGNOSIS — E559 Vitamin D deficiency, unspecified: Secondary | ICD-10-CM | POA: Diagnosis not present

## 2021-03-26 ENCOUNTER — Ambulatory Visit (INDEPENDENT_AMBULATORY_CARE_PROVIDER_SITE_OTHER): Payer: BC Managed Care – PPO | Admitting: Family Medicine

## 2021-04-06 DIAGNOSIS — N979 Female infertility, unspecified: Secondary | ICD-10-CM | POA: Diagnosis not present

## 2021-04-06 NOTE — Progress Notes (Signed)
Chief Complaint:   OBESITY Stephanie Huerta is here to discuss her progress with her obesity treatment plan along with follow-up of her obesity related diagnoses.   Today's visit was #: 7 Starting weight: 238 lbs Starting date: 11/19/2020 Today's weight: 231 lbs Today's date: 03/25/2021 Weight change since last visit: 6 lbs Total lbs lost to date: 7 lbs Body mass index is 38.44 kg/m.  Total weight loss percentage to date: -2.94%  Interim History:  Stephanie Huerta is a patient of Dr. Lawson Radar.  She has only had 2 meals off plan/indulgent meals per week.  Finds it easier to stay on plan.  Measuring foods and weighing proteins.  She changed from Category 3 to Category 4 within the last 2 weeks and she says it is working much better to control her cravings and snacking.  Plan:  Continue metformin twice daily.  Break up lunch into 2 wraps with 3 ounces of protein each to help with increased hunger (late afternoons).  Current Meal Plan: the Category 4 Plan for 80% of the time.  Current Exercise Plan: Jiu Jitsu for 90 minutes 3 times per week.  Assessment/Plan:   1. Vitamin D deficiency At goal. Current vitamin D is 53.4, tested on 11/19/2020. Optimal goal > 50 ng/dL.  She is taking vitamin D 2,000 IU daily.  Plan: Continue current OTC vitamin D supplementation.  Follow-up for routine testing of Vitamin D, at least 2-3 times per year to avoid over-replacement.  2. Class 2 severe obesity with serious comorbidity and body mass index (BMI) of 39.0 to 39.9 in adult, unspecified obesity type Stephanie Huerta)  Course: Stephanie Huerta is currently in the action stage of change. As such, her goal is to continue with weight loss efforts.   Nutrition goals: She has agreed to the Category 4 Plan.   Exercise goals:  As is.  Behavioral modification strategies: planning for success.  Stephanie Huerta has agreed to follow-up with our clinic in 2 weeks. She was informed of the importance of frequent follow-up visits to maximize her success with intensive  lifestyle modifications for her multiple health conditions.   Objective:   Blood pressure 113/71, pulse 83, temperature 97.8 F (36.6 C), height 5\' 5"  (1.651 m), weight 231 lb (104.8 kg), SpO2 99 %. Body mass index is 38.44 kg/m.  General: Cooperative, alert, well developed, in no acute distress. HEENT: Conjunctivae and lids unremarkable. Cardiovascular: Regular rhythm.  Lungs: Normal work of breathing. Neurologic: No focal deficits.   Lab Results  Component Value Date   CREATININE 0.84 11/19/2020   BUN 14 11/19/2020   NA 142 11/19/2020   K 5.0 11/19/2020   CL 105 11/19/2020   CO2 22 11/19/2020   Lab Results  Component Value Date   ALT 27 11/19/2020   AST 21 11/19/2020   ALKPHOS 78 11/19/2020   BILITOT 0.6 11/19/2020   Lab Results  Component Value Date   HGBA1C 5.3 11/19/2020   Lab Results  Component Value Date   TSH 1.690 11/19/2020   Lab Results  Component Value Date   CHOL 152 11/19/2020   HDL 50 11/19/2020   LDLCALC 83 11/19/2020   TRIG 102 11/19/2020   Lab Results  Component Value Date   WBC 10.8 11/19/2020   HGB 13.9 11/19/2020   HCT 42.7 11/19/2020   MCV 91 11/19/2020   PLT 358 11/19/2020   Attestation Statements:   Reviewed by clinician on day of visit: allergies, medications, problem list, medical history, surgical history, family history, social history,  and previous encounter notes.  Time spent on visit including pre-visit chart review and post-visit care and charting was 20 minutes.   I, Insurance claims handler, CMA, am acting as Energy manager for [employer], DO.  I have reviewed the above documentation for accuracy and completeness, and I agree with the above. Stephanie Huerta, D.O.  The 21st Century Cures Act was signed into law in 2016 which includes the topic of electronic health records.  This provides immediate access to information in MyChart.  This includes consultation notes, operative notes, office notes, lab results and pathology  reports.  If you have any questions about what you read please let us know at your next visit so we can discuss your concerns and take corrective action if need be.  We are right here with you.

## 2021-04-15 ENCOUNTER — Encounter (INDEPENDENT_AMBULATORY_CARE_PROVIDER_SITE_OTHER): Payer: Self-pay | Admitting: Family Medicine

## 2021-04-15 ENCOUNTER — Ambulatory Visit (INDEPENDENT_AMBULATORY_CARE_PROVIDER_SITE_OTHER): Payer: BC Managed Care – PPO | Admitting: Family Medicine

## 2021-04-15 ENCOUNTER — Other Ambulatory Visit: Payer: Self-pay

## 2021-04-15 VITALS — BP 124/82 | HR 66 | Temp 98.2°F | Ht 65.0 in | Wt 237.0 lb

## 2021-04-15 DIAGNOSIS — N970 Female infertility associated with anovulation: Secondary | ICD-10-CM

## 2021-04-15 DIAGNOSIS — E559 Vitamin D deficiency, unspecified: Secondary | ICD-10-CM | POA: Diagnosis not present

## 2021-04-15 DIAGNOSIS — Z6839 Body mass index (BMI) 39.0-39.9, adult: Secondary | ICD-10-CM

## 2021-04-20 NOTE — Progress Notes (Signed)
Chief Complaint:   OBESITY Stephanie Huerta is here to discuss her progress with her obesity treatment plan along with follow-up of her obesity related diagnoses. Stephanie Huerta is on the Category 4 Plan and states she is following her eating plan approximately 75% of the time. Stephanie Huerta states she is Jui Jitsu 90 minutes 3 times per week.  Today's visit was #: 8 Starting weight: 238 lbs Starting date: 11/19/2020 Today's weight: 237 lbs Today's date: 04/15/2021 Total lbs lost to date: 1 Total lbs lost since last in-office visit: 0  Interim History: Stephanie Huerta spent a significant amount of time at the pool at her in-laws and ate quite a bit off plan. There was less food on plan available while away. Breakfast and lunch are often put together. Whole day of food shifted to about 3 hours later. She did go down a pant size since her last appt.  Subjective:   1. Anovulation Stephanie Huerta sees Dr. Estanislado Pandy. Less responsive to Femara 5 mg than 2.5 mg.  2. Vitamin D deficiency Pt denies nausea, vomiting, and muscle weakness but notes fatigue. Pt is on OTC Vit D 2000 IU daily.  Assessment/Plan:   1. Anovulation Follow up with Dr. Estanislado Pandy for further management.  2. Vitamin D deficiency Low Vitamin D level contributes to fatigue and are associated with obesity, breast, and colon cancer. She agrees to continue to take OTC Vitamin D 2,000 IU daily and will follow-up for routine testing of Vitamin D, at least 2-3 times per year to avoid over-replacement.  3. Class 2 severe obesity with serious comorbidity and body mass index (BMI) of 39.0 to 39.9 in adult, unspecified obesity type (HCC)  Stephanie Huerta is currently in the action stage of change. As such, her goal is to continue with weight loss efforts. She has agreed to the Category 4 Plan.   Exercise goals:  As is  Behavioral modification strategies: increasing lean protein intake, meal planning and cooking strategies, keeping healthy foods in the home, and planning for success.  Stephanie Huerta has  agreed to follow-up with our clinic in 3 weeks. She was informed of the importance of frequent follow-up visits to maximize her success with intensive lifestyle modifications for her multiple health conditions.   Objective:   Blood pressure 124/82, pulse 66, temperature 98.2 F (36.8 C), height 5\' 5"  (1.651 m), weight 237 lb (107.5 kg), SpO2 99 %. Body mass index is 39.44 kg/m.  General: Cooperative, alert, well developed, in no acute distress. HEENT: Conjunctivae and lids unremarkable. Cardiovascular: Regular rhythm.  Lungs: Normal work of breathing. Neurologic: No focal deficits.   Lab Results  Component Value Date   CREATININE 0.84 11/19/2020   BUN 14 11/19/2020   NA 142 11/19/2020   K 5.0 11/19/2020   CL 105 11/19/2020   CO2 22 11/19/2020   Lab Results  Component Value Date   ALT 27 11/19/2020   AST 21 11/19/2020   ALKPHOS 78 11/19/2020   BILITOT 0.6 11/19/2020   Lab Results  Component Value Date   HGBA1C 5.3 11/19/2020   No results found for: INSULIN Lab Results  Component Value Date   TSH 1.690 11/19/2020   Lab Results  Component Value Date   CHOL 152 11/19/2020   HDL 50 11/19/2020   LDLCALC 83 11/19/2020   TRIG 102 11/19/2020   Lab Results  Component Value Date   VD25OH 53.4 11/19/2020   Lab Results  Component Value Date   WBC 10.8 11/19/2020   HGB 13.9 11/19/2020  HCT 42.7 11/19/2020   MCV 91 11/19/2020   PLT 358 11/19/2020   No results found for: IRON, TIBC, FERRITIN  Attestation Statements:   Reviewed by clinician on day of visit: allergies, medications, problem list, medical history, surgical history, family history, social history, and previous encounter notes.  Time spent on visit including pre-visit chart review and post-visit care and charting was 15 minutes.   Edmund Hilda, CMA, am acting as transcriptionist for Reuben Likes, MD.   I have reviewed the above documentation for accuracy and completeness, and I agree with  the above. - Reuben Likes, MD

## 2021-05-04 DIAGNOSIS — Z3184 Encounter for fertility preservation procedure: Secondary | ICD-10-CM | POA: Diagnosis not present

## 2021-05-06 ENCOUNTER — Other Ambulatory Visit: Payer: Self-pay

## 2021-05-06 ENCOUNTER — Encounter (INDEPENDENT_AMBULATORY_CARE_PROVIDER_SITE_OTHER): Payer: Self-pay | Admitting: Family Medicine

## 2021-05-06 ENCOUNTER — Ambulatory Visit (INDEPENDENT_AMBULATORY_CARE_PROVIDER_SITE_OTHER): Payer: BC Managed Care – PPO | Admitting: Family Medicine

## 2021-05-06 VITALS — BP 95/68 | HR 92 | Temp 98.1°F | Ht 65.0 in | Wt 235.0 lb

## 2021-05-06 DIAGNOSIS — Z6841 Body Mass Index (BMI) 40.0 and over, adult: Secondary | ICD-10-CM | POA: Diagnosis not present

## 2021-05-06 DIAGNOSIS — E559 Vitamin D deficiency, unspecified: Secondary | ICD-10-CM

## 2021-05-12 DIAGNOSIS — Z3202 Encounter for pregnancy test, result negative: Secondary | ICD-10-CM | POA: Diagnosis not present

## 2021-05-13 NOTE — Progress Notes (Signed)
Chief Complaint:   OBESITY Stephanie Huerta is here to discuss her progress with her obesity treatment plan along with follow-up of her obesity related diagnoses. Stephanie Huerta is on the Category 4 Plan and states she is following her eating plan approximately 70% of the time. Stephanie Huerta states she is doing Animator for 90 minutes 3 times per week.  Today's visit was #: 9 Starting weight: 238 lbs Starting date: 11/19/2020 Today's weight: 235 lbs Today's date: 05/06/2021 Total lbs lost to date: 3 Total lbs lost since last in-office visit: 2  Interim History: Stephanie Huerta has had some overtime at work. She is sticking to the meal plan about 70% of the time. A couple of days, she didn't get nearly enough for breakfast. Stephanie Huerta increased letrozole to 7.5 and got lab results back. Stephanie Huerta struggles in the morning due to tiredness.  Subjective:   1. Vitamin D deficiency She is currently taking OTC vitamin D 2,000 each day. She notes fatigue and denies nausea, vomiting or muscle weakness.  Lab Results  Component Value Date   VD25OH 53.4 11/19/2020   Assessment/Plan:   1. Vitamin D deficiency Low Vitamin D level contributes to fatigue and are associated with obesity, breast, and colon cancer. She agrees to continue to take OTC Vitamin D @2 ,000 IU every day and will follow-up for routine testing of Vitamin D, at least 2-3 times per year to avoid over-replacement.  2. Obesity with current BMI of 39.2 Stephanie Huerta is currently in the action stage of change. As such, her goal is to continue with weight loss efforts. She has agreed to the Category 4 Plan.   Exercise goals:  As is.  Behavioral modification strategies: increasing lean protein intake, meal planning and cooking strategies, keeping healthy foods in the home, and planning for success.  Stephanie Huerta has agreed to follow-up with our clinic in 3 weeks. She was informed of the importance of frequent follow-up visits to maximize her success with intensive lifestyle modifications for her  multiple health conditions.   Objective:   Blood pressure 95/68, pulse 92, temperature 98.1 F (36.7 C), height 5\' 5"  (1.651 m), weight 235 lb (106.6 kg), last menstrual period 04/13/2021, SpO2 99 %. Body mass index is 39.11 kg/m.  General: Cooperative, alert, well developed, in no acute distress. HEENT: Conjunctivae and lids unremarkable. Cardiovascular: Regular rhythm.  Lungs: Normal work of breathing. Neurologic: No focal deficits.   Lab Results  Component Value Date   CREATININE 0.84 11/19/2020   BUN 14 11/19/2020   NA 142 11/19/2020   K 5.0 11/19/2020   CL 105 11/19/2020   CO2 22 11/19/2020   Lab Results  Component Value Date   ALT 27 11/19/2020   AST 21 11/19/2020   ALKPHOS 78 11/19/2020   BILITOT 0.6 11/19/2020   Lab Results  Component Value Date   HGBA1C 5.3 11/19/2020   No results found for: INSULIN Lab Results  Component Value Date   TSH 1.690 11/19/2020   Lab Results  Component Value Date   CHOL 152 11/19/2020   HDL 50 11/19/2020   LDLCALC 83 11/19/2020   TRIG 102 11/19/2020   Lab Results  Component Value Date   VD25OH 53.4 11/19/2020   Lab Results  Component Value Date   WBC 10.8 11/19/2020   HGB 13.9 11/19/2020   HCT 42.7 11/19/2020   MCV 91 11/19/2020   PLT 358 11/19/2020   No results found for: IRON, TIBC, FERRITIN  Attestation Statements:   Reviewed by clinician on  day of visit: allergies, medications, problem list, medical history, surgical history, family history, social history, and previous encounter notes.  Time spent on visit including pre-visit chart review and post-visit care and charting was 13 minutes.   IKirke Corin, CMA, am acting as transcriptionist for Reuben Likes, MD   I have reviewed the above documentation for accuracy and completeness, and I agree with the above. - Reuben Likes, MD

## 2021-05-27 ENCOUNTER — Encounter (INDEPENDENT_AMBULATORY_CARE_PROVIDER_SITE_OTHER): Payer: Self-pay | Admitting: Family Medicine

## 2021-05-27 ENCOUNTER — Other Ambulatory Visit: Payer: Self-pay

## 2021-05-27 ENCOUNTER — Ambulatory Visit (INDEPENDENT_AMBULATORY_CARE_PROVIDER_SITE_OTHER): Payer: BC Managed Care – PPO | Admitting: Family Medicine

## 2021-05-27 VITALS — BP 112/70 | HR 75 | Temp 98.0°F | Ht 65.0 in | Wt 238.0 lb

## 2021-05-27 DIAGNOSIS — Z6839 Body mass index (BMI) 39.0-39.9, adult: Secondary | ICD-10-CM

## 2021-05-27 DIAGNOSIS — E8881 Metabolic syndrome: Secondary | ICD-10-CM

## 2021-05-28 NOTE — Progress Notes (Signed)
Chief Complaint:   OBESITY Stephanie Huerta is here to discuss her progress with her obesity treatment plan along with follow-up of her obesity related diagnoses. Stephanie Huerta is on the Category 4 Plan and states she is following her eating plan approximately 85% of the time. Stephanie Huerta states she is Stephanie Huerta 90 minutes 2 times per week.  Today's visit was #: 10 Starting weight: 238 lbs Starting date: 11/19/2020 Today's weight: 238 lbs Today's date: 05/27/2021 Total lbs lost to date: 0 Total lbs lost since last in-office visit: 0  Interim History: Stephanie Huerta's semester started yesterday- calculus 2 and physics 2. Food wise, over the last few weeks, she has done a good amount of pork, chicken, fish. Quantity wise, she's close to recommended amount even breaking dinner up into 2 separate meals. She is doing 100 calorie pack almonds, peanuts. She denies hunger.  Subjective:   1. Insulin resistance Stephanie Huerta's last A1c was 5.3 and insulin level 40.1. She is on Metformin twice a day.  Assessment/Plan:   1. Insulin resistance Stephanie Huerta will continue to work on weight loss, exercise, and decreasing simple carbohydrates to help decrease the risk of diabetes. Stephanie Huerta agreed to follow-up with [place] as directed to closely monitor her progress. Continue Metformin.  2. Obesity with current BMI of 39.7  Stephanie Huerta is currently in the action stage of change. As such, her goal is to continue with weight loss efforts. She has agreed to the Category 4 Plan.   Exercise goals: All adults should avoid inactivity. Some physical activity is better than none, and adults who participate in any amount of physical activity gain some health benefits.  Behavioral modification strategies: increasing lean protein intake, meal planning and cooking strategies, keeping healthy foods in the home, and planning for success.  Breckyn has agreed to follow-up with our clinic in 4 weeks. She was informed of the importance of frequent follow-up visits to maximize her success  with intensive lifestyle modifications for her multiple health conditions.   Objective:   Blood pressure 112/70, pulse 75, temperature 98 F (36.7 C), height 5\' 5"  (1.651 m), weight 238 lb (108 kg), last menstrual period 05/13/2021, SpO2 99 %. Body mass index is 39.61 kg/m.  General: Cooperative, alert, well developed, in no acute distress. HEENT: Conjunctivae and lids unremarkable. Cardiovascular: Regular rhythm.  Lungs: Normal work of breathing. Neurologic: No focal deficits.   Lab Results  Component Value Date   CREATININE 0.84 11/19/2020   BUN 14 11/19/2020   Stephanie Huerta 142 11/19/2020   K 5.0 11/19/2020   CL 105 11/19/2020   CO2 22 11/19/2020   Lab Results  Component Value Date   ALT 27 11/19/2020   AST 21 11/19/2020   ALKPHOS 78 11/19/2020   BILITOT 0.6 11/19/2020   Lab Results  Component Value Date   HGBA1C 5.3 11/19/2020   No results found for: INSULIN Lab Results  Component Value Date   TSH 1.690 11/19/2020   Lab Results  Component Value Date   CHOL 152 11/19/2020   HDL 50 11/19/2020   LDLCALC 83 11/19/2020   TRIG 102 11/19/2020   Lab Results  Component Value Date   VD25OH 53.4 11/19/2020   Lab Results  Component Value Date   WBC 10.8 11/19/2020   HGB 13.9 11/19/2020   HCT 42.7 11/19/2020   MCV 91 11/19/2020   PLT 358 11/19/2020    Attestation Statements:   Reviewed by clinician on day of visit: allergies, medications, problem list, medical history, surgical history, family  history, social history, and previous encounter notes.  Time spent on visit including pre-visit chart review and post-visit care and charting was 12 minutes.   Edmund Hilda, CMA, am acting as transcriptionist for Reuben Likes, MD.   I have reviewed the above documentation for accuracy and completeness, and I agree with the above. - Reuben Likes, MD

## 2021-06-18 ENCOUNTER — Encounter (INDEPENDENT_AMBULATORY_CARE_PROVIDER_SITE_OTHER): Payer: Self-pay

## 2021-06-29 ENCOUNTER — Encounter (INDEPENDENT_AMBULATORY_CARE_PROVIDER_SITE_OTHER): Payer: Self-pay | Admitting: Family Medicine

## 2021-06-29 ENCOUNTER — Other Ambulatory Visit: Payer: Self-pay

## 2021-06-29 ENCOUNTER — Ambulatory Visit (INDEPENDENT_AMBULATORY_CARE_PROVIDER_SITE_OTHER): Payer: BC Managed Care – PPO | Admitting: Family Medicine

## 2021-06-29 VITALS — BP 103/67 | HR 72 | Temp 97.8°F | Ht 65.0 in | Wt 238.0 lb

## 2021-06-29 DIAGNOSIS — Z6839 Body mass index (BMI) 39.0-39.9, adult: Secondary | ICD-10-CM

## 2021-06-29 DIAGNOSIS — Z9189 Other specified personal risk factors, not elsewhere classified: Secondary | ICD-10-CM | POA: Diagnosis not present

## 2021-06-29 DIAGNOSIS — E559 Vitamin D deficiency, unspecified: Secondary | ICD-10-CM | POA: Diagnosis not present

## 2021-06-29 DIAGNOSIS — E8881 Metabolic syndrome: Secondary | ICD-10-CM | POA: Diagnosis not present

## 2021-06-29 NOTE — Telephone Encounter (Signed)
Please advise 

## 2021-06-29 NOTE — Progress Notes (Signed)
Chief Complaint:   OBESITY Stephanie Huerta is here to discuss her progress with her obesity treatment plan along with follow-up of her obesity related diagnoses. Stephanie Huerta is on the Category 4 Plan and states she is following her eating plan approximately 80% of the time. Stephanie Huerta states she is doing [place] Jitsu 90 minutes 2 times per week.  Today's visit was #: 11 Starting weight: 238 lbs Starting date: 11/19/2020 Today's weight: 238 lbs Today's date: 06/29/2021 Total lbs lost to date: 0 Total lbs lost since last in-office visit: 0  Interim History: Stephanie Huerta has been doing work, school, and [employer]. She is doing a good amount of protein and did eat out a bit more over the weekend. She did not have eating out handout. She is doing well on 1500 mg Metformin daily. Pt's mom is currently about to undergo bariatric surgery. She has no upcoming plans for her birthday.  Subjective:   1. Insulin resistance Stephanie Huerta is on Letrozole and Glucophage. Her last A1c was 5.3.  2. Vitamin D deficiency She is on OTC Vit D and reports fatigue.  3. At risk of diabetes mellitus Stephanie Huerta is at higher than average risk for developing diabetes due to obesity.   Assessment/Plan:   1. Insulin resistance Stephanie Huerta will continue to work on weight loss, exercise, and decreasing simple carbohydrates to help decrease the risk of diabetes. Stephanie Huerta agreed to follow-up with [place] as directed to closely monitor her progress. We discussed Mounjaro today, and pt will consider but is currently trying to conceive. Check labs today.  - Comprehensive metabolic panel - Hemoglobin A1c - Insulin, random  2. Vitamin D deficiency Low Vitamin D level contributes to fatigue and are associated with obesity, breast, and colon cancer. She agrees to continue to take OTC Vitamin D and will follow-up for routine testing of Vitamin D, at least 2-3 times per year to avoid over-replacement. Check labs today.  - VITAMIN D 25 Hydroxy (Vit-D Deficiency, Fractures)  3.  At risk of diabetes mellitus Stephanie Huerta was given approximately 15 minutes of diabetes education and counseling today. We discussed intensive lifestyle modifications today with an emphasis on weight loss as well as increasing exercise and decreasing simple carbohydrates in her diet. We also reviewed medication options with an emphasis on risk versus benefit of those discussed.   Repetitive spaced learning was employed today to elicit superior memory formation and behavioral change.  4. Obesity with current BMI of 39.7  Stephanie Huerta is currently in the action stage of change. As such, her goal is to continue with weight loss efforts. She has agreed to keeping a food journal and adhering to recommended goals of 1650-1800 calories and 125+ grams protein.   Exercise goals:  As is  Behavioral modification strategies: increasing lean protein intake, decreasing eating out, meal planning and cooking strategies, and planning for success.  Stephanie Huerta has agreed to follow-up with our clinic in 3 weeks. She was informed of the importance of frequent follow-up visits to maximize her success with intensive lifestyle modifications for her multiple health conditions.   Stephanie Huerta was informed we would discuss her lab results at her next visit unless there is a critical issue that needs to be addressed sooner. Stephanie Huerta agreed to keep her next visit at the agreed upon time to discuss these results.  Objective:   Blood pressure 103/67, pulse 72, temperature 97.8 F (36.6 C), height 5\' 5"  (1.651 m), weight 238 lb (108 kg), last menstrual period 06/12/2021, SpO2 99 %. Body  mass index is 39.61 kg/m.  General: Cooperative, alert, well developed, in no acute distress. HEENT: Conjunctivae and lids unremarkable. Cardiovascular: Regular rhythm.  Lungs: Normal work of breathing. Neurologic: No focal deficits.   Lab Results  Component Value Date   CREATININE 0.84 11/19/2020   BUN 14 11/19/2020   NA 142 11/19/2020   K 5.0 11/19/2020   CL  105 11/19/2020   CO2 22 11/19/2020   Lab Results  Component Value Date   ALT 27 11/19/2020   AST 21 11/19/2020   ALKPHOS 78 11/19/2020   BILITOT 0.6 11/19/2020   Lab Results  Component Value Date   HGBA1C 5.3 11/19/2020   No results found for: INSULIN Lab Results  Component Value Date   TSH 1.690 11/19/2020   Lab Results  Component Value Date   CHOL 152 11/19/2020   HDL 50 11/19/2020   LDLCALC 83 11/19/2020   TRIG 102 11/19/2020   Lab Results  Component Value Date   VD25OH 53.4 11/19/2020   Lab Results  Component Value Date   WBC 10.8 11/19/2020   HGB 13.9 11/19/2020   HCT 42.7 11/19/2020   MCV 91 11/19/2020   PLT 358 11/19/2020   Attestation Statements:   Reviewed by clinician on day of visit: allergies, medications, problem list, medical history, surgical history, family history, social history, and previous encounter notes.  Edmund Hilda, CMA, am acting as transcriptionist for Reuben Likes, MD.   I have reviewed the above documentation for accuracy and completeness, and I agree with the above. - Reuben Likes, MD

## 2021-06-30 LAB — COMPREHENSIVE METABOLIC PANEL
ALT: 27 IU/L (ref 0–32)
AST: 20 IU/L (ref 0–40)
Albumin/Globulin Ratio: 2 (ref 1.2–2.2)
Albumin: 4.5 g/dL (ref 3.9–5.0)
Alkaline Phosphatase: 75 IU/L (ref 44–121)
BUN/Creatinine Ratio: 13 (ref 9–23)
BUN: 10 mg/dL (ref 6–20)
Bilirubin Total: 0.4 mg/dL (ref 0.0–1.2)
CO2: 21 mmol/L (ref 20–29)
Calcium: 9.5 mg/dL (ref 8.7–10.2)
Chloride: 106 mmol/L (ref 96–106)
Creatinine, Ser: 0.75 mg/dL (ref 0.57–1.00)
Globulin, Total: 2.3 g/dL (ref 1.5–4.5)
Glucose: 86 mg/dL (ref 65–99)
Potassium: 4.6 mmol/L (ref 3.5–5.2)
Sodium: 143 mmol/L (ref 134–144)
Total Protein: 6.8 g/dL (ref 6.0–8.5)
eGFR: 112 mL/min/{1.73_m2} (ref >59)

## 2021-06-30 LAB — HEMOGLOBIN A1C
Est. average glucose Bld gHb Est-mCnc: 108 mg/dL
Hgb A1c MFr Bld: 5.4 % (ref 4.8–5.6)

## 2021-06-30 LAB — INSULIN, RANDOM: INSULIN: 20.4 u[IU]/mL (ref 2.6–24.9)

## 2021-06-30 LAB — VITAMIN D 25 HYDROXY (VIT D DEFICIENCY, FRACTURES): Vit D, 25-Hydroxy: 40.3 ng/mL (ref 30.0–100.0)

## 2021-07-01 DIAGNOSIS — Z7689 Persons encountering health services in other specified circumstances: Secondary | ICD-10-CM | POA: Diagnosis not present

## 2021-07-01 DIAGNOSIS — N970 Female infertility associated with anovulation: Secondary | ICD-10-CM | POA: Diagnosis not present

## 2021-07-17 DIAGNOSIS — L680 Hirsutism: Secondary | ICD-10-CM | POA: Diagnosis not present

## 2021-07-17 DIAGNOSIS — E669 Obesity, unspecified: Secondary | ICD-10-CM | POA: Diagnosis not present

## 2021-07-17 DIAGNOSIS — E8881 Metabolic syndrome: Secondary | ICD-10-CM | POA: Diagnosis not present

## 2021-07-22 DIAGNOSIS — E559 Vitamin D deficiency, unspecified: Secondary | ICD-10-CM | POA: Diagnosis not present

## 2021-07-22 DIAGNOSIS — L680 Hirsutism: Secondary | ICD-10-CM | POA: Diagnosis not present

## 2021-07-22 DIAGNOSIS — E669 Obesity, unspecified: Secondary | ICD-10-CM | POA: Diagnosis not present

## 2021-07-22 DIAGNOSIS — E8881 Metabolic syndrome: Secondary | ICD-10-CM | POA: Diagnosis not present

## 2021-07-27 ENCOUNTER — Encounter (INDEPENDENT_AMBULATORY_CARE_PROVIDER_SITE_OTHER): Payer: Self-pay | Admitting: Family Medicine

## 2021-07-27 ENCOUNTER — Other Ambulatory Visit: Payer: Self-pay

## 2021-07-27 ENCOUNTER — Ambulatory Visit (INDEPENDENT_AMBULATORY_CARE_PROVIDER_SITE_OTHER): Payer: BC Managed Care – PPO | Admitting: Family Medicine

## 2021-07-27 VITALS — BP 114/74 | HR 75 | Temp 98.2°F | Ht 65.0 in | Wt 240.0 lb

## 2021-07-27 DIAGNOSIS — Z6839 Body mass index (BMI) 39.0-39.9, adult: Secondary | ICD-10-CM | POA: Diagnosis not present

## 2021-07-27 DIAGNOSIS — E282 Polycystic ovarian syndrome: Secondary | ICD-10-CM | POA: Diagnosis not present

## 2021-07-27 DIAGNOSIS — R7301 Impaired fasting glucose: Secondary | ICD-10-CM | POA: Diagnosis not present

## 2021-07-27 DIAGNOSIS — E8881 Metabolic syndrome: Secondary | ICD-10-CM

## 2021-07-27 MED ORDER — TIRZEPATIDE 2.5 MG/0.5ML ~~LOC~~ SOAJ: 2.5000 mg | SUBCUTANEOUS | 0 refills | Status: DC

## 2021-07-29 NOTE — Progress Notes (Signed)
Chief Complaint:   OBESITY Stephanie Huerta is here to discuss her progress with her obesity treatment plan along with follow-up of her obesity related diagnoses. See Medical Weight Management Flowsheet for complete bioelectrical impedance results.  Today's visit was #: 12 Starting weight: 238 lbs Starting date: 11/19/2020 Weight change since last visit: +2 lbs Total lbs lost to date: +2 lbs  Nutrition Plan: Keeping a food journal and adhering to recommended goals of 1650-1800 calories and 125+ grams of protein daily for 65% of the time. Activity: Martial arts for 90 minutes 2 times per week.  Interim History: Stephanie Huerta endorses polyphagia.  She would like to trial Mounjaro.  Assessment/Plan:   1. Impaired fasting glucose, with polyphagia Uncontrolled.  Current treatment: None.    Plan:  Start Mounjaro 2.5 mg subcutaneously weekly, as per below.  She understands the need for alternative birth control during this time.  She will continue to focus on protein-rich, low simple carbohydrate foods. We reviewed the importance of hydration, regular exercise for stress reduction, and restorative sleep.  - Start tirzepatide [facility]) 2.5 MG/0.5ML Pen; Inject 2.5 mg into the skin once a week.  Dispense: 2 mL; Refill: 0  2. PCOS (polycystic ovarian syndrome) Medication: metformin 500 mg daily.  Followed by GYN and Endocrinology.  She reports that Dr. Talmage Nap agrees with medication choice.  Plan:  Will stop fertility treatment for 6 months.  She will continue to focus on protein-rich, low simple carbohydrate foods. We reviewed the importance of hydration, regular exercise for stress reduction, and restorative sleep.   Counseling PCOS is a leading cause of menstrual irregularities and infertility. It is also associated with obesity, hirsutism (excessive hair growth on the face, chest, or back), and cardiovascular risk factors such as high cholesterol and insulin resistance. Insulin resistance appears to play a  central role.  Women with PCOS have been shown to have impaired appetite-regulating hormones. Women with polycystic ovary syndrome (PCOS) have an increased risk for cardiovascular disease (CVD) - European Journal of Preventive Cardiology.  3. Obesity, current BMI of 40  Course: Stephanie Huerta is currently in the action stage of change. As such, her goal is to continue with weight loss efforts.   Nutrition goals: She has agreed to keeping a food journal and adhering to recommended goals of 1650-1800 calories and 125+ grams of protein.   Exercise goals:  As is.  Behavioral modification strategies: increasing lean protein intake, decreasing simple carbohydrates, increasing vegetables, and increasing water intake.  Stephanie Huerta has agreed to follow-up with our clinic in 4 weeks. She was informed of the importance of frequent follow-up visits to maximize her success with intensive lifestyle modifications for her multiple health conditions.   Objective:   Blood pressure 114/74, pulse 75, temperature 98.2 F (36.8 C), temperature source Oral, height 5\' 5"  (1.651 m), weight 240 lb (108.9 kg), SpO2 99 %. Body mass index is 39.94 kg/m.  General: Cooperative, alert, well developed, in no acute distress. HEENT: Conjunctivae and lids unremarkable. Cardiovascular: Regular rhythm.  Lungs: Normal work of breathing. Neurologic: No focal deficits.   Lab Results  Component Value Date   CREATININE 0.75 06/29/2021   BUN 10 06/29/2021   NA 143 06/29/2021   K 4.6 06/29/2021   CL 106 06/29/2021   CO2 21 06/29/2021   Lab Results  Component Value Date   ALT 27 06/29/2021   AST 20 06/29/2021   ALKPHOS 75 06/29/2021   BILITOT 0.4 06/29/2021   Lab Results  Component Value Date  HGBA1C 5.4 06/29/2021   HGBA1C 5.3 11/19/2020   Lab Results  Component Value Date   INSULIN 20.4 06/29/2021   Lab Results  Component Value Date   TSH 1.690 11/19/2020   Lab Results  Component Value Date   CHOL 152 11/19/2020    HDL 50 11/19/2020   LDLCALC 83 11/19/2020   TRIG 102 11/19/2020   Lab Results  Component Value Date   VD25OH 40.3 06/29/2021   VD25OH 53.4 11/19/2020   Lab Results  Component Value Date   WBC 10.8 11/19/2020   HGB 13.9 11/19/2020   HCT 42.7 11/19/2020   MCV 91 11/19/2020   PLT 358 11/19/2020   Attestation Statements:   Reviewed by clinician on day of visit: allergies, medications, problem list, medical history, surgical history, family history, social history, and previous encounter notes.  Time spent on visit including pre-visit chart review and post-visit care and charting was 46 minutes. This was my first visit with this patient. Time was spent on: Food choices and timing of food intake reviewed today. I discussed a personalized meal plan with the patient that will help her to lose weight and will improve her obesity-related conditions going forward. I performed a medically necessary appropriate examination and/or evaluation. I discussed the assessment and treatment plan with the patient. Motivational interviewing as well as evidence-based interventions for health behavior change were utilized today including the discussion of self monitoring techniques, problem-solving barriers and SMART goal setting techniques.  An exercise prescription was reviewed.  The patient was provided an opportunity to ask questions and all were answered. The patient agreed with the plan and demonstrated an understanding of the instructions. Clinical information was updated and documented in the EMR.  I, Insurance claims handler, CMA, am acting as transcriptionist for Helane Rima, DO  I have reviewed the above documentation for accuracy and completeness, and I agree with the above. -  Helane Rima, DO, MS, FAAFP, DABOM - Family and Bariatric Medicine.

## 2021-07-31 DIAGNOSIS — N979 Female infertility, unspecified: Secondary | ICD-10-CM | POA: Diagnosis not present

## 2021-08-20 ENCOUNTER — Ambulatory Visit (INDEPENDENT_AMBULATORY_CARE_PROVIDER_SITE_OTHER): Payer: BC Managed Care – PPO | Admitting: Family Medicine

## 2021-08-20 ENCOUNTER — Encounter (INDEPENDENT_AMBULATORY_CARE_PROVIDER_SITE_OTHER): Payer: Self-pay | Admitting: Family Medicine

## 2021-08-20 ENCOUNTER — Other Ambulatory Visit: Payer: Self-pay

## 2021-08-20 VITALS — BP 111/72 | HR 70 | Temp 97.5°F | Ht 65.0 in | Wt 237.0 lb

## 2021-08-20 DIAGNOSIS — R7301 Impaired fasting glucose: Secondary | ICD-10-CM | POA: Diagnosis not present

## 2021-08-20 DIAGNOSIS — E282 Polycystic ovarian syndrome: Secondary | ICD-10-CM

## 2021-08-20 DIAGNOSIS — Z6839 Body mass index (BMI) 39.0-39.9, adult: Secondary | ICD-10-CM

## 2021-08-20 NOTE — Progress Notes (Signed)
Chief Complaint:   OBESITY Stephanie Huerta is here to discuss her progress with her obesity treatment plan along with follow-up of her obesity related diagnoses. See Medical Weight Management Flowsheet for complete bioelectrical impedance results.  Today's visit was #: 13 Starting weight: 238 lbs Starting date: 11/19/2020 Weight change since last visit: 1 lb Total lbs lost to date: 1 lb Total weight loss percentage to date: -0.42%  Nutrition Plan: Keeping a food journal and adhering to recommended goals of 1650-1800 calories and 125+ grams of protein daily for 60% of the time. Activity: Martial arts for 90 minutes 2 times per week.  Anti-obesity medications: Mounjaro 2.5 mg subcutaneously weekly. Reported side effects: None.  Interim History:  Stephanie Huerta has been on Mounjaro 2.5 mg x 2 doses.  She says it is helping to decrease polyphagia.  Assessment/Plan:   1. Impaired fasting glucose, with polyphagia Improving. Current treatment: Mounjaro 2.5 mg subcutaneously weekly.    Plan:  Increase Mounjaro to 5 mg subcutaneously weekly.  She will continue to focus on protein-rich, low simple carbohydrate foods. We reviewed the importance of hydration, regular exercise for stress reduction, and restorative sleep.  Meds ordered this encounter  Medications   tirzepatide (MOUNJARO) 5 MG/0.5ML Pen    Sig: Inject 5 mg into the skin once a week.    Dispense:  6 mL    Refill:  0   2. PCOS (polycystic ovarian syndrome) Medication: None.  Plan: She will continue to focus on protein-rich, low simple carbohydrate foods. We reviewed the importance of hydration, regular exercise for stress reduction, and restorative sleep.   Counseling PCOS is a leading cause of menstrual irregularities and infertility. It is also associated with obesity, hirsutism (excessive hair growth on the face, chest, or back), and cardiovascular risk factors such as high cholesterol and insulin resistance. Insulin resistance appears to  play a central role.  Women with PCOS have been shown to have impaired appetite-regulating hormones. Women with polycystic ovary syndrome (PCOS) have an increased risk for cardiovascular disease (CVD) - European Journal of Preventive Cardiology.  3. Obesity, current BMI of 37.2  Course: Stephanie Huerta is currently in the action stage of change. As such, her goal is to continue with weight loss efforts.   Nutrition goals: She has agreed to keeping a food journal and adhering to recommended goals of 1650-1800 calories and 125+ grams of protein.   Exercise goals:  As is.  Behavioral modification strategies: increasing lean protein intake, decreasing simple carbohydrates, increasing vegetables, and increasing water intake.  Stephanie Huerta has agreed to follow-up with our clinic in 4 weeks. She was informed of the importance of frequent follow-up visits to maximize her success with intensive lifestyle modifications for her multiple health conditions.   Objective:   Blood pressure 111/72, pulse 70, temperature (!) 97.5 F (36.4 C), temperature source Oral, height 5\' 5"  (1.651 m), weight 237 lb (107.5 kg), SpO2 97 %. Body mass index is 39.44 kg/m.  General: Cooperative, alert, well developed, in no acute distress. HEENT: Conjunctivae and lids unremarkable. Cardiovascular: Regular rhythm.  Lungs: Normal work of breathing. Neurologic: No focal deficits.   Lab Results  Component Value Date   CREATININE 0.75 06/29/2021   BUN 10 06/29/2021   NA 143 06/29/2021   K 4.6 06/29/2021   CL 106 06/29/2021   CO2 21 06/29/2021   Lab Results  Component Value Date   ALT 27 06/29/2021   AST 20 06/29/2021   ALKPHOS 75 06/29/2021   BILITOT 0.4  06/29/2021   Lab Results  Component Value Date   HGBA1C 5.4 06/29/2021   HGBA1C 5.3 11/19/2020   Lab Results  Component Value Date   INSULIN 20.4 06/29/2021   Lab Results  Component Value Date   TSH 1.690 11/19/2020   Lab Results  Component Value Date   CHOL 152  11/19/2020   HDL 50 11/19/2020   LDLCALC 83 11/19/2020   TRIG 102 11/19/2020   Lab Results  Component Value Date   VD25OH 40.3 06/29/2021   VD25OH 53.4 11/19/2020   Lab Results  Component Value Date   WBC 10.8 11/19/2020   HGB 13.9 11/19/2020   HCT 42.7 11/19/2020   MCV 91 11/19/2020   PLT 358 11/19/2020   Attestation Statements:   Reviewed by clinician on day of visit: allergies, medications, problem list, medical history, surgical history, family history, social history, and previous encounter notes.  I, Insurance claims handler, CMA, am acting as transcriptionist for Helane Rima, DO  I have reviewed the above documentation for accuracy and completeness, and I agree with the above. -  Helane Rima, DO, MS, FAAFP, DABOM - Family and Bariatric Medicine.

## 2021-08-22 MED ORDER — TIRZEPATIDE 5 MG/0.5ML ~~LOC~~ SOAJ
5.0000 mg | SUBCUTANEOUS | 0 refills | Status: DC
  Filled 2021-09-14: qty 2, 28d supply, fill #0

## 2021-09-10 ENCOUNTER — Encounter (INDEPENDENT_AMBULATORY_CARE_PROVIDER_SITE_OTHER): Payer: Self-pay | Admitting: Family Medicine

## 2021-09-14 ENCOUNTER — Other Ambulatory Visit (INDEPENDENT_AMBULATORY_CARE_PROVIDER_SITE_OTHER): Payer: Self-pay | Admitting: Family Medicine

## 2021-09-14 ENCOUNTER — Other Ambulatory Visit (HOSPITAL_COMMUNITY): Payer: Self-pay

## 2021-09-14 DIAGNOSIS — R7301 Impaired fasting glucose: Secondary | ICD-10-CM

## 2021-09-14 NOTE — Telephone Encounter (Signed)
Pt last seen by Dr. Wallace.  

## 2021-09-16 ENCOUNTER — Encounter (INDEPENDENT_AMBULATORY_CARE_PROVIDER_SITE_OTHER): Payer: Self-pay | Admitting: Family Medicine

## 2021-09-16 ENCOUNTER — Other Ambulatory Visit: Payer: Self-pay

## 2021-09-16 ENCOUNTER — Ambulatory Visit (INDEPENDENT_AMBULATORY_CARE_PROVIDER_SITE_OTHER): Payer: BC Managed Care – PPO | Admitting: Family Medicine

## 2021-09-16 VITALS — BP 108/70 | HR 75 | Temp 97.7°F | Ht 65.0 in | Wt 231.0 lb

## 2021-09-16 DIAGNOSIS — Z6839 Body mass index (BMI) 39.0-39.9, adult: Secondary | ICD-10-CM | POA: Diagnosis not present

## 2021-09-16 DIAGNOSIS — E559 Vitamin D deficiency, unspecified: Secondary | ICD-10-CM

## 2021-09-16 NOTE — Progress Notes (Signed)
Chief Complaint:   OBESITY Stephanie Huerta is here to discuss her progress with her obesity treatment plan along with follow-up of her obesity related diagnoses. Stephanie Huerta is on keeping a food journal and adhering to recommended goals of 1650-1800 calories and 125+ grams protein and states she is following her eating plan approximately 85% of the time. Stephanie Huerta states she is doing [place] [employer]  90 minutes 2 times per week.  Today's visit was #: 14 Starting weight: 238 lbs Starting date: 11/19/2020 Today's weight: 231 lbs Today's date: 09/16/2021 Total lbs lost to date: 7 Total lbs lost since last in-office visit: 6  Interim History: Stephanie Huerta is liking Mounjaro and has noticed how much easier it is to control indulgent options/eating. After increasing to 5 mg about 3 weeks ago, calorie intake is around 1700 and she is getting about 100 grams of protein. She is going to her parents for Christmas. Pt wants to stick to category 4 plan over the next few weeks. Her goal is 180 lbs before going off Mounjaro.  Subjective:   1. Vitamin D deficiency Pt denies nausea, vomiting, and muscle weakness but notes fatigue. She is on OTC Vit D 2,000 IU daily.  Assessment/Plan:   1. Vitamin D deficiency Low Vitamin D level contributes to fatigue and are associated with obesity, breast, and colon cancer. She agrees to continue to take OTC Vitamin D 2,000 IU daily and will follow-up for routine testing of Vitamin D, at least 2-3 times per year to avoid over-replacement.  2. Obesity, current BMI of 38.5  Stephanie Huerta is currently in the action stage of change. As such, her goal is to continue with weight loss efforts. She has agreed to the Category 4 Plan and keeping a food journal and adhering to recommended goals of 1650-1700 calories and 125+ grams protein.   Exercise goals:  As is  Behavioral modification strategies: increasing lean protein intake, meal planning and cooking strategies, keeping healthy foods in the home, and planning  for success.  Stephanie Huerta has agreed to follow-up with our clinic in 3-4 weeks. She was informed of the importance of frequent follow-up visits to maximize her success with intensive lifestyle modifications for her multiple health conditions.   Objective:   Blood pressure 108/70, pulse 75, temperature 97.7 F (36.5 C), height 5\' 5"  (1.651 m), weight 231 lb (104.8 kg), SpO2 100 %. Body mass index is 38.44 kg/m.  General: Cooperative, alert, well developed, in no acute distress. HEENT: Conjunctivae and lids unremarkable. Cardiovascular: Regular rhythm.  Lungs: Normal work of breathing. Neurologic: No focal deficits.   Lab Results  Component Value Date   CREATININE 0.75 06/29/2021   BUN 10 06/29/2021   NA 143 06/29/2021   K 4.6 06/29/2021   CL 106 06/29/2021   CO2 21 06/29/2021   Lab Results  Component Value Date   ALT 27 06/29/2021   AST 20 06/29/2021   ALKPHOS 75 06/29/2021   BILITOT 0.4 06/29/2021   Lab Results  Component Value Date   HGBA1C 5.4 06/29/2021   HGBA1C 5.3 11/19/2020   Lab Results  Component Value Date   INSULIN 20.4 06/29/2021   Lab Results  Component Value Date   TSH 1.690 11/19/2020   Lab Results  Component Value Date   CHOL 152 11/19/2020   HDL 50 11/19/2020   LDLCALC 83 11/19/2020   TRIG 102 11/19/2020   Lab Results  Component Value Date   VD25OH 40.3 06/29/2021   VD25OH 53.4 11/19/2020  Lab Results  Component Value Date   WBC 10.8 11/19/2020   HGB 13.9 11/19/2020   HCT 42.7 11/19/2020   MCV 91 11/19/2020   PLT 358 11/19/2020    Attestation Statements:   Reviewed by clinician on day of visit: allergies, medications, problem list, medical history, surgical history, family history, social history, and previous encounter notes.  Edmund Hilda, CMA, am acting as transcriptionist for Reuben Likes, MD.  I have reviewed the above documentation for accuracy and completeness, and I agree with the above. - Reuben Likes, MD

## 2021-09-20 ENCOUNTER — Encounter (INDEPENDENT_AMBULATORY_CARE_PROVIDER_SITE_OTHER): Payer: Self-pay | Admitting: Family Medicine

## 2021-09-21 NOTE — Telephone Encounter (Signed)
Please advise 

## 2021-09-22 NOTE — Telephone Encounter (Signed)
Please advise 

## 2021-10-11 NOTE — L&D Delivery Note (Addendum)
DELIVERY NOTE  Pt complete and at +2 station with urge to push. Epidural controlling pain. Pt pushed and delivered a viable female infant in ROA position. Anterior and posterior shoulders spontaneously delivered with next two pushes; body easily followed next. Infant placed on mothers abdomen and bulb suction of mouth and nose performed. Cord was then clamped and cut by FOB. Cord blood obtained, 3VC. Baby had a vigorous spontaneous cry noted. Placenta then delivered at 1351 intact. Fundal massage performed and pitocin per protocol. Fundus firm. The following lacerations were noted: BL labia, left vaginal sidewall. Repaired in routine fashion with 4-0 monocryl and 2-0 vicryl,r respectively, EBL 351cc. Mother and baby stable. Counts correct   Infant time: 1344 Gender: female Placenta time: 1351 Apgars: 8/9 Weight: pending skin-to-skin  Placenta appears grossly calcified, to pathology. Per hematology given Hz Factor V, will start lovenox 60mg  SQ QD tomorrow.

## 2021-10-13 ENCOUNTER — Other Ambulatory Visit (HOSPITAL_COMMUNITY): Payer: Self-pay

## 2021-10-13 ENCOUNTER — Encounter (INDEPENDENT_AMBULATORY_CARE_PROVIDER_SITE_OTHER): Payer: Self-pay | Admitting: Family Medicine

## 2021-10-13 ENCOUNTER — Other Ambulatory Visit: Payer: Self-pay

## 2021-10-13 ENCOUNTER — Ambulatory Visit (INDEPENDENT_AMBULATORY_CARE_PROVIDER_SITE_OTHER): Payer: BC Managed Care – PPO | Admitting: Family Medicine

## 2021-10-13 VITALS — BP 103/68 | HR 76 | Temp 97.9°F | Ht 65.0 in | Wt 232.0 lb

## 2021-10-13 DIAGNOSIS — Z124 Encounter for screening for malignant neoplasm of cervix: Secondary | ICD-10-CM | POA: Diagnosis not present

## 2021-10-13 DIAGNOSIS — Z01419 Encounter for gynecological examination (general) (routine) without abnormal findings: Secondary | ICD-10-CM | POA: Diagnosis not present

## 2021-10-13 DIAGNOSIS — Z6839 Body mass index (BMI) 39.0-39.9, adult: Secondary | ICD-10-CM | POA: Diagnosis not present

## 2021-10-13 DIAGNOSIS — N9489 Other specified conditions associated with female genital organs and menstrual cycle: Secondary | ICD-10-CM | POA: Diagnosis not present

## 2021-10-13 DIAGNOSIS — E281 Androgen excess: Secondary | ICD-10-CM | POA: Diagnosis not present

## 2021-10-13 DIAGNOSIS — R7301 Impaired fasting glucose: Secondary | ICD-10-CM | POA: Diagnosis not present

## 2021-10-13 DIAGNOSIS — E559 Vitamin D deficiency, unspecified: Secondary | ICD-10-CM | POA: Diagnosis not present

## 2021-10-13 MED ORDER — TIRZEPATIDE 7.5 MG/0.5ML ~~LOC~~ SOAJ
7.5000 mg | SUBCUTANEOUS | 0 refills | Status: DC
  Filled 2021-10-13: qty 2, 28d supply, fill #0

## 2021-10-13 NOTE — Progress Notes (Signed)
Chief Complaint:   OBESITY Stephanie Huerta is here to discuss her progress with her obesity treatment plan along with follow-up of her obesity related diagnoses. Stephanie Huerta is on the Category 4 Plan and keeping a food journal and adhering to recommended goals of 1650-1700 calories and 125+ grams protein and states she is following her eating plan approximately 70% of the time. Stephanie Huerta states she is Jiu Jitsu 90 minutes 1 times per week.  Today's visit was #: 15 Starting weight: 238 lbs Starting date: 11/19/2020 Today's weight: 232 lbs Today's date: 10/13/2021 Total lbs lost to date: 6 Total lbs lost since last in-office visit: 0  Interim History: Stephanie Huerta had a good holiday season. She stuck to the plan ~70% of the time and ate a bit too much while out of town. 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 Pt hasn't been able to get 7.5 mg Mounjaro.  Subjective:   1. Impaired fasting glucose Pt is doing ok on Mounjaro. She is noticing that dose is wearing off after 3-4 days.  2. Vitamin D deficiency Stephanie Huerta is on OTC Vit D 2,000 IU daily and reports fatigue.   Assessment/Plan:   1. Impaired fasting glucose Increase Mounjaro to 7.5 mg weekly as directed.  Refill- tirzepatide (MOUNJARO) 7.5 MG/0.5ML Pen; Inject 7.5 mg into the skin once a week.  Dispense: 6 mL; Refill: 0  2. Vitamin D deficiency Low Vitamin D level contributes to fatigue and are associated with obesity, breast, and colon cancer. She agrees to continue to take OTC Vitamin D 2,000 IU daily and will follow-up for routine testing of Vitamin D, at least 2-3 times per year to avoid over-replacement.  3. Obesity, current BMI of 38.7  Stephanie Huerta is currently in the action stage of change. As such, her goal is to continue with weight loss efforts. She has agreed to the Category 4 Plan.   Exercise goals:  As is  Behavioral modification strategies: increasing lean protein  intake, meal planning and cooking strategies, keeping healthy foods in the home, and planning for success.  Stephanie Huerta has agreed to follow-up with our clinic in 3 weeks. She was informed of the importance of frequent follow-up visits to maximize her success with intensive lifestyle modifications for her multiple health conditions.   Objective:   Blood pressure 103/68, pulse 76, temperature 97.9 F (36.6 C), height 5\' 5"  (1.651 m), weight 232 lb (105.2 kg), last menstrual period 10/08/2021, SpO2 100 %. Body mass index is 38.61 kg/m.  General: Cooperative, alert, well developed, in no acute distress. HEENT: Conjunctivae and lids unremarkable. Cardiovascular: Regular rhythm.  Lungs: Normal work of breathing. Neurologic: No focal deficits.   Lab Results  Component Value Date   CREATININE 0.75 06/29/2021   BUN 10 06/29/2021   NA 143 06/29/2021   K 4.6 06/29/2021   CL 106 06/29/2021   CO2 21 06/29/2021   Lab Results  Component Value Date   ALT 27 06/29/2021   AST 20 06/29/2021   ALKPHOS 75 06/29/2021   BILITOT 0.4 06/29/2021   Lab Results  Component Value Date   HGBA1C 5.4 06/29/2021   HGBA1C 5.3 11/19/2020   Lab Results  Component Value Date   INSULIN 20.4 06/29/2021   Lab Results  Component Value Date   TSH 1.690 11/19/2020   Lab Results  Component Value Date   CHOL 152 11/19/2020   HDL 50 11/19/2020  LDLCALC 83 11/19/2020   TRIG 102 11/19/2020   Lab Results  Component Value Date   VD25OH 40.3 06/29/2021   VD25OH 53.4 11/19/2020   Lab Results  Component Value Date   WBC 10.8 11/19/2020   HGB 13.9 11/19/2020   HCT 42.7 11/19/2020   MCV 91 11/19/2020   PLT 358 11/19/2020    Attestation Statements:   Reviewed by clinician on day of visit: allergies, medications, problem list, medical history, surgical history, family history, social history, and previous encounter notes.  Coral Ceo, CMA, am acting as transcriptionist for Coralie Common, MD.  I  have reviewed the above documentation for accuracy and completeness, and I agree with the above. - Coralie Common, MD

## 2021-10-14 DIAGNOSIS — Z124 Encounter for screening for malignant neoplasm of cervix: Secondary | ICD-10-CM | POA: Diagnosis not present

## 2021-10-15 DIAGNOSIS — B360 Pityriasis versicolor: Secondary | ICD-10-CM | POA: Diagnosis not present

## 2021-10-15 DIAGNOSIS — D2262 Melanocytic nevi of left upper limb, including shoulder: Secondary | ICD-10-CM | POA: Diagnosis not present

## 2021-10-15 DIAGNOSIS — D2261 Melanocytic nevi of right upper limb, including shoulder: Secondary | ICD-10-CM | POA: Diagnosis not present

## 2021-10-15 DIAGNOSIS — D225 Melanocytic nevi of trunk: Secondary | ICD-10-CM | POA: Diagnosis not present

## 2021-11-03 ENCOUNTER — Encounter (INDEPENDENT_AMBULATORY_CARE_PROVIDER_SITE_OTHER): Payer: Self-pay | Admitting: Family Medicine

## 2021-11-03 ENCOUNTER — Other Ambulatory Visit (HOSPITAL_COMMUNITY): Payer: Self-pay

## 2021-11-03 ENCOUNTER — Other Ambulatory Visit: Payer: Self-pay

## 2021-11-03 ENCOUNTER — Ambulatory Visit (INDEPENDENT_AMBULATORY_CARE_PROVIDER_SITE_OTHER): Payer: BC Managed Care – PPO | Admitting: Family Medicine

## 2021-11-03 VITALS — BP 106/69 | HR 87 | Temp 97.7°F | Ht 65.0 in | Wt 227.0 lb

## 2021-11-03 DIAGNOSIS — R7301 Impaired fasting glucose: Secondary | ICD-10-CM

## 2021-11-03 DIAGNOSIS — E559 Vitamin D deficiency, unspecified: Secondary | ICD-10-CM | POA: Diagnosis not present

## 2021-11-03 DIAGNOSIS — Z6839 Body mass index (BMI) 39.0-39.9, adult: Secondary | ICD-10-CM

## 2021-11-03 DIAGNOSIS — Z6837 Body mass index (BMI) 37.0-37.9, adult: Secondary | ICD-10-CM

## 2021-11-03 DIAGNOSIS — E669 Obesity, unspecified: Secondary | ICD-10-CM

## 2021-11-03 MED ORDER — TIRZEPATIDE 7.5 MG/0.5ML ~~LOC~~ SOAJ
7.5000 mg | SUBCUTANEOUS | 0 refills | Status: DC
  Filled 2021-11-03 – 2021-11-05 (×2): qty 2, 28d supply, fill #0

## 2021-11-03 NOTE — Progress Notes (Signed)
Chief Complaint:   OBESITY Stephanie Huerta is here to discuss her progress with her obesity treatment plan along with follow-up of her obesity related diagnoses. Stephanie Huerta is on the Category 4 Plan and states she is following her eating plan approximately 80% of the time. Stephanie Huerta states she is Jiu Jitsu 90 minutes 2 times per week.  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 She is getting a significant amount of activity in. She took [place] a lit late because she forgot it was time for it. Pt is getting in 1700-1900 calories per day. Not much planned for the next few weeks. Timing of eating continues to be an issue.  Subjective:   1. Impaired fasting glucose Pt is doing well on Mounjaro with no GI side effects.  2. Vitamin D deficiency Pt denies nausea, vomiting, and muscle weakness but notes fatigue. She is on OTC Vit D.  Assessment/Plan:   1. Impaired fasting glucose Continue current treatment plan.  Refill- tirzepatide (MOUNJARO) 7.5 MG/0.5ML Pen; Inject 7.5 mg into the skin once a week.  Dispense: 2 mL; Refill: 0  2. Vitamin D deficiency Low Vitamin D level contributes to fatigue and are associated with obesity, breast, and colon cancer. She agrees to continue to take OTC Vitamin D and will follow-up for routine testing of Vitamin D, at least 2-3 times per year to avoid over-replacement.  3. Obesity with current BMI of 37.8  Stephanie Huerta is currently in the action stage of change. As such, her goal is to continue with weight loss efforts. She has agreed to the Category 4 Plan.   Exercise goals:  As is  Behavioral modification strategies: increasing lean protein intake, meal planning and cooking strategies, and keeping healthy foods in the home.  Stephanie Huerta has agreed to  follow-up with our clinic in 4 weeks. She was informed of the importance of frequent follow-up visits to maximize her success with intensive lifestyle modifications for her multiple health conditions.   Objective:   Blood pressure 106/69, pulse 87, temperature 97.7 F (36.5 C), height 5\' 5"  (1.651 m), weight 227 lb (103 kg), last menstrual period 10/08/2021, SpO2 100 %. Body mass index is 37.77 kg/m.  General: Cooperative, alert, well developed, in no acute distress. HEENT: Conjunctivae and lids unremarkable. Cardiovascular: Regular rhythm.  Lungs: Normal work of breathing. Neurologic: No focal deficits.   Lab Results  Component Value Date   CREATININE 0.75 06/29/2021   BUN 10 06/29/2021   NA 143 06/29/2021   K 4.6 06/29/2021   CL 106 06/29/2021   CO2 21 06/29/2021   Lab Results  Component Value Date   ALT 27 06/29/2021   AST 20 06/29/2021   ALKPHOS 75 06/29/2021   BILITOT 0.4 06/29/2021   Lab Results  Component Value Date   HGBA1C 5.4 06/29/2021   HGBA1C 5.3 11/19/2020   Lab Results  Component Value Date   INSULIN 20.4 06/29/2021   Lab Results  Component Value Date   TSH 1.690 11/19/2020   Lab Results  Component Value Date   CHOL 152 11/19/2020   HDL 50 11/19/2020   LDLCALC 83 11/19/2020   TRIG 102 11/19/2020   Lab Results  Component Value Date   VD25OH 40.3 06/29/2021   VD25OH 53.4 11/19/2020  Lab Results  Component Value Date   WBC 10.8 11/19/2020   HGB 13.9 11/19/2020   HCT 42.7 11/19/2020   MCV 91 11/19/2020   PLT 358 11/19/2020     Attestation Statements:   Reviewed by clinician on day of visit: allergies, medications, problem list, medical history, surgical history, family history, social history, and previous encounter notes.  Coral Ceo, CMA, am acting as transcriptionist for Coralie Common, MD.  I have reviewed the above documentation for accuracy and completeness, and I agree with the above. - Coralie Common, MD

## 2021-11-06 ENCOUNTER — Other Ambulatory Visit (HOSPITAL_COMMUNITY): Payer: Self-pay

## 2021-11-13 DIAGNOSIS — Z3201 Encounter for pregnancy test, result positive: Secondary | ICD-10-CM | POA: Diagnosis not present

## 2021-11-16 ENCOUNTER — Encounter (INDEPENDENT_AMBULATORY_CARE_PROVIDER_SITE_OTHER): Payer: Self-pay | Admitting: Family Medicine

## 2021-11-16 NOTE — Telephone Encounter (Signed)
Please advise 

## 2021-11-17 DIAGNOSIS — Z3201 Encounter for pregnancy test, result positive: Secondary | ICD-10-CM | POA: Diagnosis not present

## 2021-11-19 DIAGNOSIS — Z3201 Encounter for pregnancy test, result positive: Secondary | ICD-10-CM | POA: Diagnosis not present

## 2021-11-20 DIAGNOSIS — N925 Other specified irregular menstruation: Secondary | ICD-10-CM | POA: Diagnosis not present

## 2021-11-20 DIAGNOSIS — E8881 Metabolic syndrome: Secondary | ICD-10-CM | POA: Diagnosis not present

## 2021-11-20 DIAGNOSIS — N978 Female infertility of other origin: Secondary | ICD-10-CM | POA: Diagnosis not present

## 2021-11-24 DIAGNOSIS — N925 Other specified irregular menstruation: Secondary | ICD-10-CM | POA: Diagnosis not present

## 2021-11-30 ENCOUNTER — Other Ambulatory Visit: Payer: Self-pay

## 2021-11-30 ENCOUNTER — Encounter (INDEPENDENT_AMBULATORY_CARE_PROVIDER_SITE_OTHER): Payer: Self-pay | Admitting: Family Medicine

## 2021-11-30 ENCOUNTER — Ambulatory Visit (INDEPENDENT_AMBULATORY_CARE_PROVIDER_SITE_OTHER): Payer: BC Managed Care – PPO | Admitting: Family Medicine

## 2021-11-30 VITALS — BP 108/71 | HR 84 | Temp 97.5°F | Ht 65.0 in | Wt 230.0 lb

## 2021-11-30 DIAGNOSIS — E669 Obesity, unspecified: Secondary | ICD-10-CM | POA: Diagnosis not present

## 2021-11-30 DIAGNOSIS — E8881 Metabolic syndrome: Secondary | ICD-10-CM | POA: Diagnosis not present

## 2021-11-30 DIAGNOSIS — E559 Vitamin D deficiency, unspecified: Secondary | ICD-10-CM | POA: Diagnosis not present

## 2021-11-30 DIAGNOSIS — Z6838 Body mass index (BMI) 38.0-38.9, adult: Secondary | ICD-10-CM

## 2021-11-30 NOTE — Progress Notes (Signed)
Chief Complaint:   OBESITY Stephanie Huerta is here to discuss her progress with her obesity treatment plan along with follow-up of her obesity related diagnoses. Stephanie Huerta is on the Category 4 Plan and states she is following her eating plan approximately 70% of the time. Stephanie Huerta states she is doing Stephanie Huerta 60 minutes 2 times per week.  Today's visit was #: 63 Starting weight: 238 lbs Starting date: 11/19/2020 Today's weight: 230 lbs Today's date: 11/30/2021 Total lbs lost to date: 8 Total lbs lost since last in-office visit: 0  Interim History: Pt found out she is pregnant! She has been taking in what she can tolerate. She is trying to get some nutrition in when she can. Pt is not taking anything for nausea but is feeling nauseous frequently. EGA [redacted]w[redacted]d. She is wondering about how to get some more consistent food in. Pt is back on Metformin.  Subjective:   1. Vitamin D deficiency Stephanie Huerta is on OTC Vit D 2K IU daily.  2. Insulin resistance Pt is on Metformin 1000 mg BID. She was previously on Mounjaro.  Assessment/Plan:   1. Vitamin D deficiency Low Vitamin D level contributes to fatigue and are associated with obesity, breast, and colon cancer. She agrees to continue to take OTC Vitamin D 2,000 IU daily and will follow-up for routine testing of Vitamin D, at least 2-3 times per year to avoid over-replacement.  2. Insulin resistance Stephanie Huerta will continue to work on weight loss, exercise, and decreasing simple carbohydrates to help decrease the risk of diabetes. Stephanie Huerta agreed to follow-up with [place] as directed to closely monitor her progress. Continue Metformin with no change in dose.  3. Obesity with current BMI of 38.3 Stephanie Huerta is currently in the action stage of change. As such, her goal is to continue with weight loss efforts. She has agreed to practicing portion control and making smarter food choices, such as increasing vegetables and decreasing simple carbohydrates.   Exercise goals:  As is  Behavioral  modification strategies: increasing lean protein intake, meal planning and cooking strategies, keeping healthy foods in the home, and planning for success.  Stephanie Huerta has agreed to follow-up with our clinic in 6-8 weeks. She was informed of the importance of frequent follow-up visits to maximize her success with intensive lifestyle modifications for her multiple health conditions.   Objective:   Blood pressure 108/71, pulse 84, temperature (!) 97.5 F (36.4 C), height 5\' 5"  (1.651 m), weight 230 lb (104.3 kg), last menstrual period 10/08/2021, SpO2 100 %. Body mass index is 38.27 kg/m.  General: Cooperative, alert, well developed, in no acute distress. HEENT: Conjunctivae and lids unremarkable. Cardiovascular: Regular rhythm.  Lungs: Normal work of breathing. Neurologic: No focal deficits.   Lab Results  Component Value Date   CREATININE 0.75 06/29/2021   BUN 10 06/29/2021   NA 143 06/29/2021   K 4.6 06/29/2021   CL 106 06/29/2021   CO2 21 06/29/2021   Lab Results  Component Value Date   ALT 27 06/29/2021   AST 20 06/29/2021   ALKPHOS 75 06/29/2021   BILITOT 0.4 06/29/2021   Lab Results  Component Value Date   HGBA1C 5.4 06/29/2021   HGBA1C 5.3 11/19/2020   Lab Results  Component Value Date   INSULIN 20.4 06/29/2021   Lab Results  Component Value Date   TSH 1.690 11/19/2020   Lab Results  Component Value Date   CHOL 152 11/19/2020   HDL 50 11/19/2020   LDLCALC 83 11/19/2020  TRIG 102 11/19/2020   Lab Results  Component Value Date   VD25OH 40.3 06/29/2021   VD25OH 53.4 11/19/2020   Lab Results  Component Value Date   WBC 10.8 11/19/2020   HGB 13.9 11/19/2020   HCT 42.7 11/19/2020   MCV 91 11/19/2020   PLT 358 11/19/2020    Attestation Statements:   Reviewed by clinician on day of visit: allergies, medications, problem list, medical history, surgical history, family history, social history, and previous encounter notes.  Coral Ceo, CMA, am  acting as transcriptionist for Coralie Common, MD.  I have reviewed the above documentation for accuracy and completeness, and I agree with the above. - Coralie Common, MD

## 2021-12-08 DIAGNOSIS — O3680X9 Pregnancy with inconclusive fetal viability, other fetus: Secondary | ICD-10-CM | POA: Diagnosis not present

## 2021-12-08 DIAGNOSIS — N978 Female infertility of other origin: Secondary | ICD-10-CM | POA: Diagnosis not present

## 2021-12-08 DIAGNOSIS — E8881 Metabolic syndrome: Secondary | ICD-10-CM | POA: Diagnosis not present

## 2021-12-24 DIAGNOSIS — Z113 Encounter for screening for infections with a predominantly sexual mode of transmission: Secondary | ICD-10-CM | POA: Diagnosis not present

## 2021-12-24 DIAGNOSIS — Z368A Encounter for antenatal screening for other genetic defects: Secondary | ICD-10-CM | POA: Diagnosis not present

## 2021-12-24 DIAGNOSIS — Z3491 Encounter for supervision of normal pregnancy, unspecified, first trimester: Secondary | ICD-10-CM | POA: Diagnosis not present

## 2021-12-24 DIAGNOSIS — O26891 Other specified pregnancy related conditions, first trimester: Secondary | ICD-10-CM | POA: Diagnosis not present

## 2021-12-24 DIAGNOSIS — Z3689 Encounter for other specified antenatal screening: Secondary | ICD-10-CM | POA: Diagnosis not present

## 2021-12-24 DIAGNOSIS — N96 Recurrent pregnancy loss: Secondary | ICD-10-CM | POA: Diagnosis not present

## 2021-12-24 DIAGNOSIS — Z363 Encounter for antenatal screening for malformations: Secondary | ICD-10-CM | POA: Diagnosis not present

## 2021-12-24 DIAGNOSIS — Z3A11 11 weeks gestation of pregnancy: Secondary | ICD-10-CM | POA: Diagnosis not present

## 2021-12-24 LAB — OB RESULTS CONSOLE ABO/RH: RH Type: POSITIVE

## 2021-12-24 LAB — OB RESULTS CONSOLE ANTIBODY SCREEN: Antibody Screen: NEGATIVE

## 2021-12-24 LAB — OB RESULTS CONSOLE RUBELLA ANTIBODY, IGM: Rubella: IMMUNE

## 2021-12-24 LAB — OB RESULTS CONSOLE GC/CHLAMYDIA
Chlamydia: NEGATIVE
Neisseria Gonorrhea: NEGATIVE

## 2021-12-24 LAB — OB RESULTS CONSOLE HEPATITIS B SURFACE ANTIGEN: Hepatitis B Surface Ag: NEGATIVE

## 2021-12-24 LAB — OB RESULTS CONSOLE RPR: RPR: NONREACTIVE

## 2021-12-24 LAB — OB RESULTS CONSOLE HIV ANTIBODY (ROUTINE TESTING): HIV: NONREACTIVE

## 2021-12-24 LAB — HEPATITIS C ANTIBODY: HCV Ab: NEGATIVE

## 2022-01-07 DIAGNOSIS — E8881 Metabolic syndrome: Secondary | ICD-10-CM | POA: Diagnosis not present

## 2022-01-07 DIAGNOSIS — L680 Hirsutism: Secondary | ICD-10-CM | POA: Diagnosis not present

## 2022-01-07 DIAGNOSIS — E669 Obesity, unspecified: Secondary | ICD-10-CM | POA: Diagnosis not present

## 2022-01-11 ENCOUNTER — Ambulatory Visit (INDEPENDENT_AMBULATORY_CARE_PROVIDER_SITE_OTHER): Payer: BC Managed Care – PPO | Admitting: Family Medicine

## 2022-01-11 ENCOUNTER — Encounter (INDEPENDENT_AMBULATORY_CARE_PROVIDER_SITE_OTHER): Payer: Self-pay | Admitting: Family Medicine

## 2022-01-11 VITALS — BP 110/72 | HR 78 | Temp 97.8°F | Ht 65.0 in | Wt 235.0 lb

## 2022-01-11 DIAGNOSIS — E282 Polycystic ovarian syndrome: Secondary | ICD-10-CM | POA: Diagnosis not present

## 2022-01-11 DIAGNOSIS — Z6839 Body mass index (BMI) 39.0-39.9, adult: Secondary | ICD-10-CM | POA: Diagnosis not present

## 2022-01-11 DIAGNOSIS — E669 Obesity, unspecified: Secondary | ICD-10-CM

## 2022-01-11 DIAGNOSIS — D6851 Activated protein C resistance: Secondary | ICD-10-CM

## 2022-01-14 NOTE — Progress Notes (Signed)
? ? ? ?Chief Complaint:  ? ?OBESITY ?Stephanie Huerta is here to discuss her progress with her obesity treatment plan along with follow-up of her obesity related diagnoses. Stephanie Huerta is on practicing portion control and making smarter food choices, such as increasing vegetables and decreasing simple carbohydrates and states she is following her eating plan approximately 70% of the time. Stephanie Huerta states she is doing jui jitsu for 60 minutes 2 times per week, and walking 1 mile 3 times per week.  ? ?Today's visit was #: 18 ?Starting weight: 238 lbs ?Starting date: 11/19/2020 ?Today's weight: 235 lbs ?Today's date: 01/11/2022 ?Total lbs lost to date: 3 ?Total lbs lost since last in-office visit: 0 ? ?Interim History: Stephanie Huerta is still experiencing some nausea  with her pregnancy ([redacted] weeks gestation). She is doing her glucose test early. She is feeling some significant fatigue. Eating more carbs than previously. Meal replacement shake in the morning. Microwave meal at lunch and [place] at supper.  ? ?Subjective:  ? ?1. Heterozygous factor V Leiden mutation (HCC) ?Stephanie Huerta started on  baby aspirin. Father has FX 5. ? ?2. PCOS (polycystic ovarian syndrome) ?Stephanie Huerta is on metformin, and she denies GI side effects even with increase in carb intake. ? ?Assessment/Plan:  ? ?1. Heterozygous factor V Leiden mutation (HCC) ?Stephanie Huerta will follow up with her OB for further management.  ? ?2. PCOS (polycystic ovarian syndrome) ?Stephanie Huerta will continue metformin, with no change in dose.  ? ?3. Obesity with current BMI of 39.2 ?Stephanie Huerta is currently in the action stage of change. As such, her goal is to continue with weight loss efforts. She has agreed to practicing portion control and making smarter food choices, such as increasing vegetables and decreasing simple carbohydrates.  ? ?Exercise goals: All adults should avoid inactivity. Some physical activity is better than none, and adults who participate in any amount of physical activity gain some health  benefits. ? ?Behavioral modification strategies: increasing lean protein intake, meal planning and cooking strategies, keeping healthy foods in the home, and planning for success. ? ?Stephanie Huerta has agreed to follow-up with our clinic in 8 to 10 weeks. She was informed of the importance of frequent follow-up visits to maximize her success with intensive lifestyle modifications for her multiple health conditions.  ? ?Objective:  ? ?Blood pressure 110/72, pulse 78, temperature 97.8 ?F (36.6 ?C), height 5\' 5"  (1.651 m), weight 235 lb (106.6 kg), last menstrual period 10/08/2021, SpO2 100 %. ?Body mass index is 39.11 kg/m?. ? ?General: Cooperative, alert, well developed, in no acute distress. ?HEENT: Conjunctivae and lids unremarkable. ?Cardiovascular: Regular rhythm.  ?Lungs: Normal work of breathing. ?Neurologic: No focal deficits.  ? ?Lab Results  ?Component Value Date  ? CREATININE 0.75 06/29/2021  ? BUN 10 06/29/2021  ? NA 143 06/29/2021  ? K 4.6 06/29/2021  ? CL 106 06/29/2021  ? CO2 21 06/29/2021  ? ?Lab Results  ?Component Value Date  ? ALT 27 06/29/2021  ? AST 20 06/29/2021  ? ALKPHOS 75 06/29/2021  ? BILITOT 0.4 06/29/2021  ? ?Lab Results  ?Component Value Date  ? HGBA1C 5.4 06/29/2021  ? HGBA1C 5.3 11/19/2020  ? ?Lab Results  ?Component Value Date  ? INSULIN 20.4 06/29/2021  ? ?Lab Results  ?Component Value Date  ? TSH 1.690 11/19/2020  ? ?Lab Results  ?Component Value Date  ? CHOL 152 11/19/2020  ? HDL 50 11/19/2020  ? LDLCALC 83 11/19/2020  ? TRIG 102 11/19/2020  ? ?Lab Results  ?Component Value Date  ?  VD25OH 40.3 06/29/2021  ? VD25OH 53.4 11/19/2020  ? ?Lab Results  ?Component Value Date  ? WBC 10.8 11/19/2020  ? HGB 13.9 11/19/2020  ? HCT 42.7 11/19/2020  ? MCV 91 11/19/2020  ? PLT 358 11/19/2020  ? ?No results found for: IRON, TIBC, FERRITIN ? ?Attestation Statements:  ? ?Reviewed by clinician on day of visit: allergies, medications, problem list, medical history, surgical history, family history, social  history, and previous encounter notes. ? ? ?I, Burt Knack, am acting as transcriptionist for Reuben Likes, MD. ? ?I have reviewed the above documentation for accuracy and completeness, and I agree with the above. Reuben Likes, MD ? ? ?

## 2022-01-20 DIAGNOSIS — E8881 Metabolic syndrome: Secondary | ICD-10-CM | POA: Diagnosis not present

## 2022-01-20 DIAGNOSIS — Z331 Pregnant state, incidental: Secondary | ICD-10-CM | POA: Diagnosis not present

## 2022-01-25 DIAGNOSIS — Z3689 Encounter for other specified antenatal screening: Secondary | ICD-10-CM | POA: Diagnosis not present

## 2022-02-15 NOTE — Therapy (Signed)
?OUTPATIENT PHYSICAL THERAPY FEMALE PELVIC EVALUATION ? ? ?Patient Name: Laurieanne Huerta ?MRN: [id number] ?DOB:[birth year], 31 y.o., female ?Today's Date: 02/17/2022 ? ? PT End of Session - 02/17/22 2036   ? ? Visit Number 1   ? Date for PT Re-Evaluation 05/11/22   ? Authorization Type BCBS   ? PT Start Time 1235   ? PT Stop Time 1314   ? PT Time Calculation (min) 39 min   ? Activity Tolerance Patient tolerated treatment well   ? Behavior During Therapy [facility] for tasks assessed/performed   ? ?  ?  ? ?  ? ? ?Past Medical History:  ?Diagnosis Date  ? Asthma   ? Hirsutism   ? Hyperandrogenemia   ? Insulin resistance   ? Joint pain   ? Obesity   ? PCOS (polycystic ovarian syndrome)   ? Right knee injury   ? Shortness of breath   ? Torn meniscus   ? ?Past Surgical History:  ?Procedure Laterality Date  ? wisdom teeth extraction  02/2016  ? ?There are no problems to display for this patient. ? ? ?PCP: Silverio Lay, MD ? ?REFERRING PROVIDER: Huel Cote, MD ? ?REFERRING DIAG: R32 (ICD-10-CM) - Urine incontinence ? ?THERAPY DIAG:  ?Muscle weakness (generalized) ? ?ONSET DATE: last month ? ?SUBJECTIVE:                                                                                                                                                                                          ? ?SUBJECTIVE STATEMENT: ?I feel when I cough  or sneeze everything feels heavy and just want to make sure everything is good.  I do ju jitsu 2x/week it feels hard to get up fast. ?Fluid intake: 80-90 oz ? ?Patient confirms identification and approves PT to assess pelvic floor and treatment Yes ? ? ?PAIN:  ?Are you having pain? No ? ? ?PRECAUTIONS: None ? ?WEIGHT BEARING RESTRICTIONS No ? ?FALLS:  ?Has patient fallen in last 6 months? No ? ?LIVING ENVIRONMENT: ?Lives with: lives with their spouse ?Lives in: House/apartment ? ?OCCUPATION: manufacturing, standing ? ?PLOF: Independent ? ?PATIENT GOALS not feel the heavy feeling ? ?PERTINENT  HISTORY:  ?Pregnant currently 18 weeks; obesity, PCOS, asthma ?Sexual abuse: No ? ?BOWEL MOVEMENT ?Pain with bowel movement: No ?No difficulty ? ?URINATION ?Pain with urination: No ?Fully empty bladder: No feels like it doesn't totally empty ?Stream: Strong ?Urgency: No ?Frequency: every hour ?Leakage:  No ? ?INTERCOURSE ?Pain with intercourse:  no ? ? ?PREGNANCY ?Vaginal deliveries n/a ? ?PROLAPSE ?None ? ? ? ?OBJECTIVE:  ? ? ?PATIENT SURVEYS:  ? ? ?PFIQ-7 24 for  POPIQ ? ?COGNITION: ? Overall cognitive status: Within functional limits for tasks assessed   ?  ? ? ?MUSCLE LENGTH: ?Hamstrings: Right 90 deg; Left 80 deg ? ? ?LUMBAR SPECIAL TESTS:  ?ASLR improved with pelvic compression somewhat; lumbar compression moderately ? ?FUNCTIONAL TESTS:  ? ?Squat withincreased anterior pelvic tilt ?GAIT: ? ?Comments: WFL ? ?POSTURE:  ?Increased anterior tilt; decreased thoracic kyphosis ? ?LUMBARAROM/PROM ? ?A/PROM A/PROM  ?02/17/2022  ?Flexion 75%  ?Extension   ?Right lateral flexion   ?Left lateral flexion   ?Right rotation   ?Left rotation   ? (Blank rows = not tested) ? ?LE ROM: ?WFL throughout ? ?LE MMT: ?Left hip abd, add 4/5 ? ?PELVIC MMT: ?  ?MMT  ?02/17/2022  ?Vaginal 2/5 x 9 sec; 4 reps  ?Internal Anal Sphincter   ?External Anal Sphincter   ?Puborectalis   ?Diastasis Recti   ?(Blank rows = not tested) ? ?      PALPATION: ?  General  lumbar paraspinals tight ? ?              External Perineal Exam  ?              ?              Internal Pelvic Floor no tenderness, tight with decreased closure of pubo muscles ? ?TONE: ?high ? ?PROLAPSE: ?no ? ?TODAY'S TREATMENT  ?EVAL  ? ? ?PATIENT EDUCATION:  ? ? ? ?HOME EXERCISE PROGRAM: ? ? ?ASSESSMENT: ? ?CLINICAL IMPRESSION: ?Patient is a 31 y.o. female who was seen today for physical therapy evaluation and treatment for pelvic pain. Pt has pelvic floor muscle tension and weakness.  Pt experiencing more pressure with progression of pregnancy and she has muscle tension and weakness.  Pt has hamstring tension and core weakness as noted with ASLR.  Pt will benefit from skilled PT to address all above mentioned impairments so she can have safe and healthy pregnancy. ? ? ?OBJECTIVE IMPAIRMENTS decreased coordination, decreased endurance, decreased ROM, decreased strength, increased fascial restrictions, and increased muscle spasms.  ? ?ACTIVITY LIMITATIONS community activity.  ? ?PERSONAL FACTORS 1-2 comorbidities: pregnancy and high level of activites  are also affecting patient's functional outcome.  ? ? ?REHAB POTENTIAL: Excellent ? ?CLINICAL DECISION MAKING: Evolving/moderate complexity ? ?EVALUATION COMPLEXITY: Moderate ? ? ?GOALS: ?Goals reviewed with patient? Yes ? ?SHORT TERM GOALS: Target date: 03/16/2022 ? ?Pt ind with initial HEP ?Baseline: ?Goal status: INITIAL ? ? ? ? ?LONG TERM GOALS: Target date: 05/11/2022 ? ?Pt will be independent with advanced HEP to maintain improvements made throughout therapy ? ?Baseline:  ?Goal status: INITIAL ? ?2.  Pt will report 80% reduction of pain due to improvements in posture, strength, and muscle length ? ?Baseline:  ?Goal status: INITIAL ? ?3.  Pt will be able to run/workout for 45 minutes without leakage or discomfort ? ?Baseline:  ?Goal status: INITIAL ? ?4.  Pt will have knowledge of how to safely exercise throughout pregnancy to avoid increased risk of prolapse ?Baseline:  ?Goal status: INITIAL ? ? ? ?PLAN: ?PT FREQUENCY: 1x/week ? ?PT DURATION:  12 weeks  sessions ? ?PLANNED INTERVENTIONS: Therapeutic exercises, Therapeutic activity, Neuromuscular re-education, Balance training, Gait training, Patient/Family education, Joint mobilization, Electrical stimulation, Cryotherapy, Moist heat, Taping, Biofeedback, and Manual therapy ? ?PLAN FOR NEXT SESSION: stretchse, review healthy exercise and precautions, core strengh ? ? ?Junious Silk, PT ?02/17/2022, 8:37 PM ? ?

## 2022-02-16 ENCOUNTER — Ambulatory Visit: Payer: BC Managed Care – PPO | Attending: Obstetrics and Gynecology | Admitting: Physical Therapy

## 2022-02-16 DIAGNOSIS — M62838 Other muscle spasm: Secondary | ICD-10-CM | POA: Diagnosis not present

## 2022-02-16 DIAGNOSIS — M6281 Muscle weakness (generalized): Secondary | ICD-10-CM | POA: Insufficient documentation

## 2022-02-16 DIAGNOSIS — Z5189 Encounter for other specified aftercare: Secondary | ICD-10-CM | POA: Diagnosis not present

## 2022-02-16 DIAGNOSIS — R32 Unspecified urinary incontinence: Secondary | ICD-10-CM | POA: Diagnosis not present

## 2022-03-04 DIAGNOSIS — Z3A21 21 weeks gestation of pregnancy: Secondary | ICD-10-CM | POA: Diagnosis not present

## 2022-03-04 DIAGNOSIS — Z363 Encounter for antenatal screening for malformations: Secondary | ICD-10-CM | POA: Diagnosis not present

## 2022-03-29 ENCOUNTER — Encounter (INDEPENDENT_AMBULATORY_CARE_PROVIDER_SITE_OTHER): Payer: Self-pay | Admitting: Family Medicine

## 2022-03-29 ENCOUNTER — Ambulatory Visit (INDEPENDENT_AMBULATORY_CARE_PROVIDER_SITE_OTHER): Payer: BC Managed Care – PPO | Admitting: Family Medicine

## 2022-03-29 VITALS — BP 111/65 | HR 80 | Temp 97.6°F | Ht 65.0 in | Wt 246.0 lb

## 2022-03-29 DIAGNOSIS — Z3A24 24 weeks gestation of pregnancy: Secondary | ICD-10-CM

## 2022-03-29 DIAGNOSIS — E669 Obesity, unspecified: Secondary | ICD-10-CM

## 2022-03-29 DIAGNOSIS — Z6841 Body Mass Index (BMI) 40.0 and over, adult: Secondary | ICD-10-CM

## 2022-03-29 DIAGNOSIS — E8881 Metabolic syndrome: Secondary | ICD-10-CM

## 2022-03-30 NOTE — Progress Notes (Unsigned)
Chief Complaint:   OBESITY Stephanie Huerta is here to discuss her progress with her obesity treatment plan along with follow-up of her obesity related diagnoses. Stephanie Huerta is on practicing portion control and making smarter food choices, such as increasing vegetables and decreasing simple carbohydrates and states she is following her eating plan approximately 80% of the time. Stephanie Huerta states she is doing Jujitsu and yoga 60 minutes 2 times per week.  Today's visit was #: 19 Starting weight: 238 lbs Starting date: 11/19/2020 Today's weight: 246 lbs Today's date: 03/29/2022 Total lbs lost to date: 0 lbs Total lbs lost since last in-office visit: 3  Interim History: Stephanie Huerta is currently [redacted] weeks gestational. She is on baby Asprin daily. Eating frequently through out the day as she could not get everything in during sitting. She has no plans for the summer, no hunger. She did a glucose test somewhat early and it was normal.  Subjective:   1. Insulin resistance Stephanie Huerta is not on Glucophage. Her last A1c was 5.1, insulin was 10.  2. [redacted] weeks gestation of pregnancy Stephanie Huerta is currently [redacted] week gestational. She see Dr. Estanislado Pandy.  Assessment/Plan:   1. Insulin resistance Patient has labs with Dr. Horald Pollen in Oct.  2. [redacted] weeks gestation of pregnancy Patient will follow up in November postpartum.  3. Obesity with current BMI of 41.0 Stephanie Huerta is currently in the action stage of change. As such, her goal is to continue with weight loss efforts. She has agreed to the Category 4 Plan.   Exercise goals: All adults should avoid inactivity. Some physical activity is better than none, and adults who participate in any amount of physical activity gain some health benefits.  Behavioral modification strategies: increasing lean protein intake, meal planning and cooking strategies, and keeping healthy foods in the home.  Stephanie Huerta has agreed to follow-up with our clinic in 15 weeks. She was informed of the importance of frequent follow-up  visits to maximize her success with intensive lifestyle modifications for her multiple health conditions.   Objective:   Blood pressure 111/65, pulse 80, temperature 97.6 F (36.4 C), height 5\' 5"  (1.651 m), weight 246 lb (111.6 kg), last menstrual period 10/08/2021, SpO2 98 %. Body mass index is 40.94 kg/m.  General: Cooperative, alert, well developed, in no acute distress. HEENT: Conjunctivae and lids unremarkable. Cardiovascular: Regular rhythm.  Lungs: Normal work of breathing. Neurologic: No focal deficits.   Lab Results  Component Value Date   CREATININE 0.75 06/29/2021   BUN 10 06/29/2021   NA 143 06/29/2021   K 4.6 06/29/2021   CL 106 06/29/2021   CO2 21 06/29/2021   Lab Results  Component Value Date   ALT 27 06/29/2021   AST 20 06/29/2021   ALKPHOS 75 06/29/2021   BILITOT 0.4 06/29/2021   Lab Results  Component Value Date   HGBA1C 5.4 06/29/2021   HGBA1C 5.3 11/19/2020   Lab Results  Component Value Date   INSULIN 20.4 06/29/2021   Lab Results  Component Value Date   TSH 1.690 11/19/2020   Lab Results  Component Value Date   CHOL 152 11/19/2020   HDL 50 11/19/2020   LDLCALC 83 11/19/2020   TRIG 102 11/19/2020   Lab Results  Component Value Date   VD25OH 40.3 06/29/2021   VD25OH 53.4 11/19/2020   Lab Results  Component Value Date   WBC 10.8 11/19/2020   HGB 13.9 11/19/2020   HCT 42.7 11/19/2020   MCV 91 11/19/2020   PLT  358 11/19/2020   No results found for: "IRON", "TIBC", "FERRITIN"  Attestation Statements:   Reviewed by clinician on day of visit: allergies, medications, problem list, medical history, surgical history, family history, social history, and previous encounter notes.  I, Fortino Sic, RMA am acting as transcriptionist for Reuben Likes, MD.  I have reviewed the above documentation for accuracy and completeness, and I agree with the above. -  ***

## 2022-04-05 DIAGNOSIS — Z362 Encounter for other antenatal screening follow-up: Secondary | ICD-10-CM | POA: Diagnosis not present

## 2022-04-05 DIAGNOSIS — Z3A25 25 weeks gestation of pregnancy: Secondary | ICD-10-CM | POA: Diagnosis not present

## 2022-04-05 DIAGNOSIS — O99212 Obesity complicating pregnancy, second trimester: Secondary | ICD-10-CM | POA: Diagnosis not present

## 2022-04-07 ENCOUNTER — Encounter: Payer: Self-pay | Admitting: *Deleted

## 2022-04-08 ENCOUNTER — Ambulatory Visit: Payer: BC Managed Care – PPO | Attending: Obstetrics and Gynecology

## 2022-04-08 ENCOUNTER — Other Ambulatory Visit: Payer: Self-pay | Admitting: Obstetrics and Gynecology

## 2022-04-08 ENCOUNTER — Ambulatory Visit (HOSPITAL_BASED_OUTPATIENT_CLINIC_OR_DEPARTMENT_OTHER): Payer: BC Managed Care – PPO | Admitting: Obstetrics and Gynecology

## 2022-04-08 ENCOUNTER — Ambulatory Visit: Payer: BC Managed Care – PPO | Admitting: *Deleted

## 2022-04-08 VITALS — BP 108/54 | HR 82

## 2022-04-08 DIAGNOSIS — Z363 Encounter for antenatal screening for malformations: Secondary | ICD-10-CM | POA: Diagnosis not present

## 2022-04-08 DIAGNOSIS — O99119 Other diseases of the blood and blood-forming organs and certain disorders involving the immune mechanism complicating pregnancy, unspecified trimester: Secondary | ICD-10-CM

## 2022-04-08 DIAGNOSIS — E669 Obesity, unspecified: Secondary | ICD-10-CM

## 2022-04-08 DIAGNOSIS — O365920 Maternal care for other known or suspected poor fetal growth, second trimester, not applicable or unspecified: Secondary | ICD-10-CM

## 2022-04-08 DIAGNOSIS — O99112 Other diseases of the blood and blood-forming organs and certain disorders involving the immune mechanism complicating pregnancy, second trimester: Secondary | ICD-10-CM

## 2022-04-08 DIAGNOSIS — O99212 Obesity complicating pregnancy, second trimester: Secondary | ICD-10-CM | POA: Insufficient documentation

## 2022-04-08 DIAGNOSIS — Z3A26 26 weeks gestation of pregnancy: Secondary | ICD-10-CM

## 2022-04-08 DIAGNOSIS — D6851 Activated protein C resistance: Secondary | ICD-10-CM

## 2022-04-08 NOTE — Progress Notes (Signed)
Maternal-Fetal Medicine   Name: Stephanie Huerta DOB: 1990/11/25 MRN: [id number] Referring Provider: Huel Cote, MD  I had the pleasure of seeing Ms. Stephanie [facility] today at the [facility]. She is G2 P0 at 26-weeks' gestation and is here for ultrasound evaluation.  At your office ultrasound, small head circumference was noted.    Her prenatal course was otherwise uneventful.  On cell-free fetal DNA screening, the risks of fetal aneuploidies are not increased.  Past medical history: No history of diabetes or hypertension.  Patient has factor V Leiden mutation.  No personal history of venous thromboembolism.  Past surgical history: Wisdom tooth removal. Medications: Prenatal vitamins, low-dose aspirin, vitamin D, Pepcid. Allergies: No known drug allergies. Social history: Denies tobacco or drug or alcohol use. Family history: Father had venous thromboembolism.  Ultrasound On today's ultrasound, the estimated fetal weight is at the 12 percentile and the abdominal circumference measurement is at the 8th percentile.  Head circumference measurement is at between mean and -1 SD (normal; no evidence of microcephaly).  Amniotic fluid is normal and good fetal activity seen.  Fetal anatomical survey appears normal but limited by fetal position. Umbilical artery Doppler showed normal forward diastolic flow. As maternal obesity imposes limitations on the resolution of images, fetal anomalies may be missed.  Fetal growth restriction I explained the finding of fetal growth restriction that is difficult to differentiate from a constitutionally small for gestational age fetus.  Patient is 5 feet 5 inches tall and her husband is 5 feet 8 inches tall.  I discussed the possible causes of fetal growth restriction including placental insufficiency (most common cause), chromosomal anomalies and fetal infections.  Patient did not give history of fever or rashes.  I explained that only  amniocentesis will give a definitive result on the fetal karyotype and some genetic conditions (Microarray).  I explained amniocentesis procedure and possible complication of preterm delivery (1 and 500 procedures).  Patient opted not to have amniocentesis.  I discussed ultrasound protocol of monitoring fetal growth restriction.  Timing of delivery: Since small for gestational age fetuses have a higher chance of having perinatal mortality and morbidity, delivery at 46 to [redacted] weeks gestation is reasonable if fetal growth restriction persists.  If, however, severe fetal growth restriction is seen with normal antenatal testing, we will recommend delivery at [redacted] weeks gestation.  Factor V Leiden mutation Patient is not taking heparin anticoagulation.  She, however, has a strong family history.  I recommend postpartum anticoagulation for 6 months regardless of mode of delivery.  Recommendations -An appointment was made for her to return in 2 weeks for umbilical artery Doppler study in 3 weeks for fetal growth assessment. -Postpartum anticoagulation (Lovenox 40 mg subcutaneously daily) for 6 weeks.  Thank you for consultation.  If you have any questions or concerns, please contact me the [facility].  Consultation including face-to-face (more than 50%) counseling 30 minutes.

## 2022-04-09 ENCOUNTER — Other Ambulatory Visit: Payer: Self-pay | Admitting: *Deleted

## 2022-04-09 DIAGNOSIS — D6851 Activated protein C resistance: Secondary | ICD-10-CM

## 2022-04-09 DIAGNOSIS — R638 Other symptoms and signs concerning food and fluid intake: Secondary | ICD-10-CM

## 2022-04-09 DIAGNOSIS — O365920 Maternal care for other known or suspected poor fetal growth, second trimester, not applicable or unspecified: Secondary | ICD-10-CM

## 2022-04-18 ENCOUNTER — Inpatient Hospital Stay (HOSPITAL_COMMUNITY)
Admission: AD | Admit: 2022-04-18 | Discharge: 2022-04-19 | Disposition: A | Discharge status: Home / Self Care | Payer: BC Managed Care – PPO | Attending: Obstetrics | Admitting: Obstetrics

## 2022-04-18 DIAGNOSIS — O162 Unspecified maternal hypertension, second trimester: Secondary | ICD-10-CM | POA: Diagnosis not present

## 2022-04-18 DIAGNOSIS — Z3A27 27 weeks gestation of pregnancy: Secondary | ICD-10-CM

## 2022-04-18 DIAGNOSIS — O368120 Decreased fetal movements, second trimester, not applicable or unspecified: Secondary | ICD-10-CM | POA: Diagnosis not present

## 2022-04-18 DIAGNOSIS — O169 Unspecified maternal hypertension, unspecified trimester: Secondary | ICD-10-CM

## 2022-04-18 DIAGNOSIS — Z3493 Encounter for supervision of normal pregnancy, unspecified, third trimester: Secondary | ICD-10-CM

## 2022-04-18 DIAGNOSIS — O365920 Maternal care for other known or suspected poor fetal growth, second trimester, not applicable or unspecified: Secondary | ICD-10-CM | POA: Insufficient documentation

## 2022-04-18 DIAGNOSIS — Z3689 Encounter for other specified antenatal screening: Secondary | ICD-10-CM

## 2022-04-18 LAB — URINALYSIS, ROUTINE W REFLEX MICROSCOPIC
Bilirubin Urine: NEGATIVE
Glucose, UA: NEGATIVE mg/dL
Hgb urine dipstick: NEGATIVE
Ketones, ur: NEGATIVE mg/dL
Leukocytes,Ua: NEGATIVE
Nitrite: NEGATIVE
Protein, ur: NEGATIVE mg/dL
Specific Gravity, Urine: 1.026 (ref 1.005–1.030)
pH: 5 (ref 5.0–8.0)

## 2022-04-18 NOTE — MAU Note (Signed)
.  Stephanie Huerta is a 31 y.o. at [redacted]w[redacted]d here in MAU reporting: decreased fetal x 5 hours-states around 5pm feeling just"taps" Baby IUGR being followed by MFM.  Denies contractions or cramping, Reports dull ache in LU Abdomen. Deies leaking of fluid or vaginal bleeding.  Onset of complaint: 1700 Pain score:  Vitals:   04/18/22 2238  BP: (!) 138/92  Pulse: 100  Resp: 18  Temp: 97.9 F (36.6 C)  SpO2: 97%     FHT:137-144 audible movement noted - pt states she did not feeling them Lab orders placed from triage:

## 2022-04-18 NOTE — MAU Provider Note (Signed)
History     CSN: [id number]  Arrival date and time: 04/18/22 2219   Event Date/Time   First Provider Initiated Contact with Patient 04/18/22 2319      Chief Complaint  Patient presents with   Decreased Fetal Movement   HPI  Stephanie Huerta is a 31 y.o. G1P0000 at [redacted]w[redacted]d who presents for evaluation of decreased fetal movement. Patient reports for the last 5 hours the baby has not been moving normally and she is only feeling "taps." She reports she is followed by MFM for IUGR and they had warned her about decreased fetal movement. She denies any pain. She denies any vaginal bleeding, discharge, and leaking of fluid. Denies any constipation, diarrhea or any urinary complaints.  Upon arrival to MAU, patient found to be hypertensive on initial BP. She denies any hx of hypertension and feels that this is related to stress. She denies any HA, visual changes or epigastric pain.  OB History     Gravida  1   Para  0   Term  0   Preterm  0   AB  0   Living  0      SAB  0   IAB  0   Ectopic  0   Multiple  0   Live Births  0           Past Medical History:  Diagnosis Date   Asthma    Hirsutism    Hyperandrogenemia    Insulin resistance    Joint pain    Obesity    PCOS (polycystic ovarian syndrome)    Right knee injury    Shortness of breath    Torn meniscus     Past Surgical History:  Procedure Laterality Date   dilation and curetage     wisdom teeth extraction  02/2016    Family History  Problem Relation Age of Onset   Hypertension Mother    Obesity Mother    Heart disease Father    Kidney disease Father    Depression Father    Anxiety disorder Father    Sleep apnea Father    Obesity Father    Hypertension Maternal Grandmother    Stroke Maternal Grandmother    Hyperlipidemia Maternal Grandmother    Cancer Maternal Grandfather    Cancer Paternal Grandmother    Diabetes Paternal Grandmother    Diabetes Paternal Grandfather     Social  History   Tobacco Use   Smoking status: Never    Passive exposure: Never   Smokeless tobacco: Never  Vaping Use   Vaping Use: Former  Substance Use Topics   Alcohol use: Not Currently   Drug use: Never    Allergies: No Known Allergies  No medications prior to admission.    Review of Systems  Constitutional: Negative.  Negative for fatigue and fever.  HENT: Negative.    Respiratory: Negative.  Negative for shortness of breath.   Cardiovascular: Negative.  Negative for chest pain.  Gastrointestinal: Negative.  Negative for abdominal pain, constipation, diarrhea, nausea and vomiting.  Genitourinary: Negative.  Negative for dysuria, vaginal bleeding and vaginal discharge.  Neurological: Negative.  Negative for dizziness and headaches.   Physical Exam   Blood pressure 132/68, pulse 89, temperature 97.9 F (36.6 C), temperature source Oral, resp. rate 18, height 5\' 5"  (1.651 m), weight 116.6 kg, last menstrual period 10/08/2021, SpO2 99 %.  Patient Vitals for the past 24 hrs:  BP Temp Temp src Pulse Resp SpO2  Height Weight  04/19/22 0030 132/68 -- -- 89 -- 99 % -- --  04/18/22 2323 130/66 -- -- 84 -- -- -- --  04/18/22 2238 (!) 138/92 97.9 F (36.6 C) Oral 100 18 97 % 5\' 5"  (1.651 m) 116.6 kg    Physical Exam Vitals and nursing note reviewed.  Constitutional:      General: She is not in acute distress.    Appearance: She is well-developed.  HENT:     Head: Normocephalic.  Eyes:     Pupils: Pupils are equal, round, and reactive to light.  Cardiovascular:     Rate and Rhythm: Normal rate and regular rhythm.     Heart sounds: Normal heart sounds.  Pulmonary:     Effort: Pulmonary effort is normal. No respiratory distress.     Breath sounds: Normal breath sounds.  Abdominal:     General: Bowel sounds are normal. There is no distension.     Palpations: Abdomen is soft.     Tenderness: There is no abdominal tenderness.  Skin:    General: Skin is warm and dry.   Neurological:     Mental Status: She is alert and oriented to person, place, and time.  Psychiatric:        Mood and Affect: Mood normal.        Behavior: Behavior normal.        Thought Content: Thought content normal.        Judgment: Judgment normal.    Fetal Tracing:  Baseline: 125 Variability: moderate Accels: 15x15 Decels: none   Toco: none  MAU Course  Procedures  Results for orders placed or performed during the hospital encounter of 04/18/22 (from the past 24 hour(s))  Protein / creatinine ratio, urine     Status: None   Collection Time: 04/18/22 11:31 PM  Result Value Ref Range   Creatinine, Urine 172.85 mg/dL   Total Protein, Urine 8 mg/dL   Protein Creatinine Ratio 0.05 0.00 - 0.15 mg/mg[Cre]  Urinalysis, Routine w reflex microscopic     Status: None   Collection Time: 04/18/22 11:31 PM  Result Value Ref Range   Color, Urine YELLOW YELLOW   APPearance CLEAR CLEAR   Specific Gravity, Urine 1.026 1.005 - 1.030   pH 5.0 5.0 - 8.0   Glucose, UA NEGATIVE NEGATIVE mg/dL   Hgb urine dipstick NEGATIVE NEGATIVE   Bilirubin Urine NEGATIVE NEGATIVE   Ketones, ur NEGATIVE NEGATIVE mg/dL   Protein, ur NEGATIVE NEGATIVE mg/dL   Nitrite NEGATIVE NEGATIVE   Leukocytes,Ua NEGATIVE NEGATIVE  CBC     Status: Abnormal   Collection Time: 04/18/22 11:44 PM  Result Value Ref Range   WBC 16.7 (H) 4.0 - 10.5 K/uL   RBC 3.47 (L) 3.87 - 5.11 MIL/uL   Hemoglobin 10.9 (L) 12.0 - 15.0 g/dL   HCT 06/19/22 (L) 53.9 - 76.7 %   MCV 94.5 80.0 - 100.0 fL   MCH 31.4 26.0 - 34.0 pg   MCHC 33.2 30.0 - 36.0 g/dL   RDW 34.1 93.7 - 90.2 %   Platelets 227 150 - 400 K/uL   nRBC 0.0 0.0 - 0.2 %  Comprehensive metabolic panel     Status: Abnormal   Collection Time: 04/18/22 11:44 PM  Result Value Ref Range   Sodium 136 135 - 145 mmol/L   Potassium 3.5 3.5 - 5.1 mmol/L   Chloride 106 98 - 111 mmol/L   CO2 21 (L) 22 - 32 mmol/L   Glucose, Bld  101 (H) 70 - 99 mg/dL   BUN 9 6 - 20 mg/dL    Creatinine, Ser 3.23 0.44 - 1.00 mg/dL   Calcium 8.8 (L) 8.9 - 10.3 mg/dL   Total Protein 5.4 (L) 6.5 - 8.1 g/dL   Albumin 2.6 (L) 3.5 - 5.0 g/dL   AST 17 15 - 41 U/L   ALT 21 0 - 44 U/L   Alkaline Phosphatase 55 38 - 126 U/L   Total Bilirubin 0.6 0.3 - 1.2 mg/dL   GFR, Estimated >55 >73 mL/min   Anion gap 9 5 - 15       MDM Prenatal records from community office reviewed. Pregnancy complicated by IUGR Labs ordered and reviewed.   UA CBC, CMP, Protein/creat ratio  Since arrival to MAU, patient reports return of normal fetal movement  No further elevated BPs and patient asymptomatic. Patient has appointment in office tomorrow and will have BP checked at that time. gHTN and preeclampsia precautions reviewed at length.    Assessment and Plan   1. Movement of fetus present during pregnancy in third trimester   2. NST (non-stress test) reactive   3. Elevated blood pressure affecting pregnancy, antepartum   4. [redacted] weeks gestation of pregnancy     -Discharge home in stable condition -Hypertension precautions discussed -Patient advised to follow-up with OB as scheduled tomorrow for prenatal care -Patient may return to MAU as needed or if her condition were to change or worsen  Rolm Bookbinder, CNM 04/19/2022, 3:17 AM

## 2022-04-19 DIAGNOSIS — O169 Unspecified maternal hypertension, unspecified trimester: Secondary | ICD-10-CM | POA: Diagnosis not present

## 2022-04-19 DIAGNOSIS — Z3A27 27 weeks gestation of pregnancy: Secondary | ICD-10-CM | POA: Diagnosis not present

## 2022-04-19 LAB — COMPREHENSIVE METABOLIC PANEL
ALT: 21 U/L (ref 0–44)
AST: 17 U/L (ref 15–41)
Albumin: 2.6 g/dL — ABNORMAL LOW (ref 3.5–5.0)
Alkaline Phosphatase: 55 U/L (ref 38–126)
Anion gap: 9 (ref 5–15)
BUN: 9 mg/dL (ref 6–20)
CO2: 21 mmol/L — ABNORMAL LOW (ref 22–32)
Calcium: 8.8 mg/dL — ABNORMAL LOW (ref 8.9–10.3)
Chloride: 106 mmol/L (ref 98–111)
Creatinine, Ser: 0.7 mg/dL (ref 0.44–1.00)
GFR, Estimated: >60 mL/min (ref >60)
Glucose, Bld: 101 mg/dL — ABNORMAL HIGH (ref 70–99)
Potassium: 3.5 mmol/L (ref 3.5–5.1)
Sodium: 136 mmol/L (ref 135–145)
Total Bilirubin: 0.6 mg/dL (ref 0.3–1.2)
Total Protein: 5.4 g/dL — ABNORMAL LOW (ref 6.5–8.1)

## 2022-04-19 LAB — CBC
HCT: 32.8 % — ABNORMAL LOW (ref 36.0–46.0)
Hemoglobin: 10.9 g/dL — ABNORMAL LOW (ref 12.0–15.0)
MCH: 31.4 pg (ref 26.0–34.0)
MCHC: 33.2 g/dL (ref 30.0–36.0)
MCV: 94.5 fL (ref 80.0–100.0)
Platelets: 227 10*3/uL (ref 150–400)
RBC: 3.47 MIL/uL — ABNORMAL LOW (ref 3.87–5.11)
RDW: 13.2 % (ref 11.5–15.5)
WBC: 16.7 10*3/uL — ABNORMAL HIGH (ref 4.0–10.5)
nRBC: 0 % (ref 0.0–0.2)

## 2022-04-19 LAB — PROTEIN / CREATININE RATIO, URINE
Creatinine, Urine: 172.85 mg/dL
Protein Creatinine Ratio: 0.05 mg/mg{Cre} (ref 0.00–0.15)
Total Protein, Urine: 8 mg/dL

## 2022-04-19 NOTE — Discharge Instructions (Signed)

## 2022-04-20 DIAGNOSIS — Z23 Encounter for immunization: Secondary | ICD-10-CM | POA: Diagnosis not present

## 2022-04-20 DIAGNOSIS — R030 Elevated blood-pressure reading, without diagnosis of hypertension: Secondary | ICD-10-CM | POA: Diagnosis not present

## 2022-04-20 DIAGNOSIS — Z3689 Encounter for other specified antenatal screening: Secondary | ICD-10-CM | POA: Diagnosis not present

## 2022-04-20 DIAGNOSIS — O09292 Supervision of pregnancy with other poor reproductive or obstetric history, second trimester: Secondary | ICD-10-CM | POA: Diagnosis not present

## 2022-04-20 DIAGNOSIS — Z3A27 27 weeks gestation of pregnancy: Secondary | ICD-10-CM | POA: Diagnosis not present

## 2022-04-21 ENCOUNTER — Ambulatory Visit: Payer: BC Managed Care – PPO | Attending: Obstetrics and Gynecology

## 2022-04-21 ENCOUNTER — Ambulatory Visit: Payer: BC Managed Care – PPO | Admitting: *Deleted

## 2022-04-21 ENCOUNTER — Ambulatory Visit (HOSPITAL_BASED_OUTPATIENT_CLINIC_OR_DEPARTMENT_OTHER): Payer: BC Managed Care – PPO | Admitting: *Deleted

## 2022-04-21 VITALS — BP 125/58 | HR 79

## 2022-04-21 DIAGNOSIS — R638 Other symptoms and signs concerning food and fluid intake: Secondary | ICD-10-CM | POA: Diagnosis not present

## 2022-04-21 DIAGNOSIS — D6851 Activated protein C resistance: Secondary | ICD-10-CM | POA: Insufficient documentation

## 2022-04-21 DIAGNOSIS — O365920 Maternal care for other known or suspected poor fetal growth, second trimester, not applicable or unspecified: Secondary | ICD-10-CM

## 2022-04-21 DIAGNOSIS — O99112 Other diseases of the blood and blood-forming organs and certain disorders involving the immune mechanism complicating pregnancy, second trimester: Secondary | ICD-10-CM

## 2022-04-21 DIAGNOSIS — Z3A27 27 weeks gestation of pregnancy: Secondary | ICD-10-CM

## 2022-04-21 DIAGNOSIS — Z363 Encounter for antenatal screening for malformations: Secondary | ICD-10-CM

## 2022-04-21 DIAGNOSIS — O99119 Other diseases of the blood and blood-forming organs and certain disorders involving the immune mechanism complicating pregnancy, unspecified trimester: Secondary | ICD-10-CM

## 2022-04-21 DIAGNOSIS — E669 Obesity, unspecified: Secondary | ICD-10-CM

## 2022-04-21 DIAGNOSIS — O99212 Obesity complicating pregnancy, second trimester: Secondary | ICD-10-CM

## 2022-04-21 NOTE — Procedures (Signed)
Stephanie Huerta December 03, 1990 [redacted]w[redacted]d  Fetus A Non-Stress Test Interpretation for 04/21/22  Indication: IUGR  Fetal Heart Rate A Mode: External Baseline Rate (A): 130 bpm Variability: Moderate Accelerations: 10 x 10 Decelerations: Variable Multiple birth?: No  Uterine Activity Mode: Palpation, Toco Contraction Frequency (min): none Resting Tone Palpated: Relaxed  Interpretation (Fetal Testing) Nonstress Test Interpretation: Reactive Overall Impression: Reassuring for gestational age Comments: Dr. Grace Bushy reviewed tracing

## 2022-04-22 ENCOUNTER — Other Ambulatory Visit: Payer: Self-pay | Admitting: *Deleted

## 2022-04-22 DIAGNOSIS — O365990 Maternal care for other known or suspected poor fetal growth, unspecified trimester, not applicable or unspecified: Secondary | ICD-10-CM

## 2022-04-22 DIAGNOSIS — O99212 Obesity complicating pregnancy, second trimester: Secondary | ICD-10-CM

## 2022-04-27 DIAGNOSIS — O99810 Abnormal glucose complicating pregnancy: Secondary | ICD-10-CM | POA: Diagnosis not present

## 2022-04-30 ENCOUNTER — Other Ambulatory Visit: Payer: Self-pay | Admitting: Obstetrics and Gynecology

## 2022-04-30 ENCOUNTER — Ambulatory Visit: Payer: BC Managed Care – PPO | Admitting: *Deleted

## 2022-04-30 ENCOUNTER — Ambulatory Visit: Payer: BC Managed Care – PPO | Attending: Obstetrics and Gynecology

## 2022-04-30 VITALS — BP 116/56 | HR 87

## 2022-04-30 DIAGNOSIS — R638 Other symptoms and signs concerning food and fluid intake: Secondary | ICD-10-CM | POA: Diagnosis not present

## 2022-04-30 DIAGNOSIS — O365930 Maternal care for other known or suspected poor fetal growth, third trimester, not applicable or unspecified: Secondary | ICD-10-CM | POA: Diagnosis not present

## 2022-04-30 DIAGNOSIS — O365931 Maternal care for other known or suspected poor fetal growth, third trimester, fetus 1: Secondary | ICD-10-CM

## 2022-04-30 DIAGNOSIS — Z364 Encounter for antenatal screening for fetal growth retardation: Secondary | ICD-10-CM

## 2022-04-30 DIAGNOSIS — D6851 Activated protein C resistance: Secondary | ICD-10-CM | POA: Insufficient documentation

## 2022-04-30 DIAGNOSIS — Z3A29 29 weeks gestation of pregnancy: Secondary | ICD-10-CM | POA: Diagnosis not present

## 2022-04-30 DIAGNOSIS — O365920 Maternal care for other known or suspected poor fetal growth, second trimester, not applicable or unspecified: Secondary | ICD-10-CM | POA: Insufficient documentation

## 2022-04-30 DIAGNOSIS — O99119 Other diseases of the blood and blood-forming organs and certain disorders involving the immune mechanism complicating pregnancy, unspecified trimester: Secondary | ICD-10-CM | POA: Diagnosis not present

## 2022-05-07 ENCOUNTER — Ambulatory Visit: Payer: BC Managed Care – PPO | Admitting: *Deleted

## 2022-05-07 ENCOUNTER — Ambulatory Visit (HOSPITAL_BASED_OUTPATIENT_CLINIC_OR_DEPARTMENT_OTHER): Payer: BC Managed Care – PPO | Admitting: *Deleted

## 2022-05-07 ENCOUNTER — Ambulatory Visit: Payer: BC Managed Care – PPO | Attending: Maternal & Fetal Medicine

## 2022-05-07 ENCOUNTER — Encounter: Payer: Self-pay | Admitting: *Deleted

## 2022-05-07 VITALS — BP 114/59 | HR 88

## 2022-05-07 DIAGNOSIS — Z3A30 30 weeks gestation of pregnancy: Secondary | ICD-10-CM

## 2022-05-07 DIAGNOSIS — O365930 Maternal care for other known or suspected poor fetal growth, third trimester, not applicable or unspecified: Secondary | ICD-10-CM | POA: Diagnosis not present

## 2022-05-07 DIAGNOSIS — O99212 Obesity complicating pregnancy, second trimester: Secondary | ICD-10-CM | POA: Insufficient documentation

## 2022-05-07 DIAGNOSIS — O365990 Maternal care for other known or suspected poor fetal growth, unspecified trimester, not applicable or unspecified: Secondary | ICD-10-CM | POA: Insufficient documentation

## 2022-05-07 DIAGNOSIS — E669 Obesity, unspecified: Secondary | ICD-10-CM | POA: Diagnosis not present

## 2022-05-07 DIAGNOSIS — O99213 Obesity complicating pregnancy, third trimester: Secondary | ICD-10-CM | POA: Diagnosis not present

## 2022-05-07 NOTE — Procedures (Signed)
Stephanie Huerta 07/04/1991 [redacted]w[redacted]d  Fetus A Non-Stress Test Interpretation for 05/07/22  Indication: IUGR  Fetal Heart Rate A Mode: External Baseline Rate (A): 120 bpm Variability: Moderate Accelerations: 15 x 15 Decelerations: None Multiple birth?: No  Uterine Activity Mode: Toco Contraction Frequency (min): none Resting Tone Palpated: Relaxed  Interpretation (Fetal Testing) Nonstress Test Interpretation: Reactive Overall Impression: Reassuring for gestational age Comments: tracing reviewed by Dr. Parke Poisson

## 2022-05-14 ENCOUNTER — Ambulatory Visit: Payer: BC Managed Care – PPO | Attending: Maternal & Fetal Medicine

## 2022-05-14 ENCOUNTER — Ambulatory Visit: Payer: BC Managed Care – PPO | Admitting: *Deleted

## 2022-05-14 VITALS — BP 124/65 | HR 85

## 2022-05-14 DIAGNOSIS — O365930 Maternal care for other known or suspected poor fetal growth, third trimester, not applicable or unspecified: Secondary | ICD-10-CM

## 2022-05-14 DIAGNOSIS — O99212 Obesity complicating pregnancy, second trimester: Secondary | ICD-10-CM | POA: Diagnosis not present

## 2022-05-14 DIAGNOSIS — O99213 Obesity complicating pregnancy, third trimester: Secondary | ICD-10-CM

## 2022-05-14 DIAGNOSIS — O365931 Maternal care for other known or suspected poor fetal growth, third trimester, fetus 1: Secondary | ICD-10-CM | POA: Diagnosis not present

## 2022-05-14 DIAGNOSIS — O365990 Maternal care for other known or suspected poor fetal growth, unspecified trimester, not applicable or unspecified: Secondary | ICD-10-CM | POA: Diagnosis not present

## 2022-05-14 DIAGNOSIS — Z3A31 31 weeks gestation of pregnancy: Secondary | ICD-10-CM

## 2022-05-14 DIAGNOSIS — E669 Obesity, unspecified: Secondary | ICD-10-CM

## 2022-05-14 NOTE — Procedures (Signed)
Stephanie Huerta 11-06-1990 [redacted]w[redacted]d  Fetus A Non-Stress Test Interpretation for 05/14/22  Indication: IUGR  Fetal Heart Rate A Mode: External Baseline Rate (A): 120 bpm Variability: Moderate, Minimal Accelerations: 15 x 15 Decelerations: None Multiple birth?: No  Uterine Activity Mode: Palpation, Toco Contraction Frequency (min): none Resting Tone Palpated: Relaxed  Interpretation (Fetal Testing) Nonstress Test Interpretation: Reactive Overall Impression: Reassuring for gestational age Comments: Dr. Judeth Cornfield reviewed tracing

## 2022-05-19 ENCOUNTER — Encounter (INDEPENDENT_AMBULATORY_CARE_PROVIDER_SITE_OTHER): Payer: Self-pay

## 2022-05-21 ENCOUNTER — Ambulatory Visit: Payer: BC Managed Care – PPO | Admitting: *Deleted

## 2022-05-21 ENCOUNTER — Ambulatory Visit: Payer: BC Managed Care – PPO

## 2022-05-21 ENCOUNTER — Other Ambulatory Visit: Payer: Self-pay | Admitting: Maternal & Fetal Medicine

## 2022-05-21 ENCOUNTER — Other Ambulatory Visit: Payer: Self-pay | Admitting: *Deleted

## 2022-05-21 ENCOUNTER — Ambulatory Visit: Payer: BC Managed Care – PPO | Attending: Maternal & Fetal Medicine

## 2022-05-21 VITALS — BP 109/55 | HR 78

## 2022-05-21 DIAGNOSIS — E669 Obesity, unspecified: Secondary | ICD-10-CM

## 2022-05-21 DIAGNOSIS — O365990 Maternal care for other known or suspected poor fetal growth, unspecified trimester, not applicable or unspecified: Secondary | ICD-10-CM

## 2022-05-21 DIAGNOSIS — O365931 Maternal care for other known or suspected poor fetal growth, third trimester, fetus 1: Secondary | ICD-10-CM | POA: Diagnosis not present

## 2022-05-21 DIAGNOSIS — O99213 Obesity complicating pregnancy, third trimester: Secondary | ICD-10-CM

## 2022-05-21 DIAGNOSIS — O365930 Maternal care for other known or suspected poor fetal growth, third trimester, not applicable or unspecified: Secondary | ICD-10-CM | POA: Diagnosis not present

## 2022-05-21 DIAGNOSIS — O24415 Gestational diabetes mellitus in pregnancy, controlled by oral hypoglycemic drugs: Secondary | ICD-10-CM

## 2022-05-21 DIAGNOSIS — O99212 Obesity complicating pregnancy, second trimester: Secondary | ICD-10-CM

## 2022-05-21 DIAGNOSIS — Z3A32 32 weeks gestation of pregnancy: Secondary | ICD-10-CM

## 2022-05-25 ENCOUNTER — Other Ambulatory Visit: Payer: Self-pay | Admitting: *Deleted

## 2022-05-25 DIAGNOSIS — O99213 Obesity complicating pregnancy, third trimester: Secondary | ICD-10-CM

## 2022-05-25 DIAGNOSIS — O365930 Maternal care for other known or suspected poor fetal growth, third trimester, not applicable or unspecified: Secondary | ICD-10-CM

## 2022-05-28 ENCOUNTER — Ambulatory Visit: Payer: BC Managed Care – PPO | Attending: Maternal & Fetal Medicine

## 2022-05-28 ENCOUNTER — Ambulatory Visit: Payer: BC Managed Care – PPO | Admitting: *Deleted

## 2022-05-28 ENCOUNTER — Other Ambulatory Visit: Payer: Self-pay | Admitting: *Deleted

## 2022-05-28 VITALS — BP 114/57 | HR 82

## 2022-05-28 DIAGNOSIS — Z3A33 33 weeks gestation of pregnancy: Secondary | ICD-10-CM

## 2022-05-28 DIAGNOSIS — O365930 Maternal care for other known or suspected poor fetal growth, third trimester, not applicable or unspecified: Secondary | ICD-10-CM | POA: Insufficient documentation

## 2022-05-28 DIAGNOSIS — E669 Obesity, unspecified: Secondary | ICD-10-CM | POA: Diagnosis not present

## 2022-05-28 DIAGNOSIS — O99213 Obesity complicating pregnancy, third trimester: Secondary | ICD-10-CM

## 2022-05-28 DIAGNOSIS — Z7984 Long term (current) use of oral hypoglycemic drugs: Secondary | ICD-10-CM | POA: Diagnosis not present

## 2022-05-28 DIAGNOSIS — O365990 Maternal care for other known or suspected poor fetal growth, unspecified trimester, not applicable or unspecified: Secondary | ICD-10-CM | POA: Diagnosis not present

## 2022-05-28 DIAGNOSIS — O99212 Obesity complicating pregnancy, second trimester: Secondary | ICD-10-CM | POA: Insufficient documentation

## 2022-06-03 ENCOUNTER — Ambulatory Visit: Payer: BC Managed Care – PPO | Admitting: *Deleted

## 2022-06-03 ENCOUNTER — Ambulatory Visit: Payer: BC Managed Care – PPO | Attending: Obstetrics and Gynecology

## 2022-06-03 VITALS — BP 124/61 | HR 91

## 2022-06-03 DIAGNOSIS — Z3A34 34 weeks gestation of pregnancy: Secondary | ICD-10-CM

## 2022-06-03 DIAGNOSIS — O365930 Maternal care for other known or suspected poor fetal growth, third trimester, not applicable or unspecified: Secondary | ICD-10-CM | POA: Diagnosis not present

## 2022-06-03 DIAGNOSIS — E669 Obesity, unspecified: Secondary | ICD-10-CM

## 2022-06-03 DIAGNOSIS — D6851 Activated protein C resistance: Secondary | ICD-10-CM | POA: Diagnosis not present

## 2022-06-03 DIAGNOSIS — O99213 Obesity complicating pregnancy, third trimester: Secondary | ICD-10-CM | POA: Diagnosis not present

## 2022-06-03 DIAGNOSIS — O99113 Other diseases of the blood and blood-forming organs and certain disorders involving the immune mechanism complicating pregnancy, third trimester: Secondary | ICD-10-CM

## 2022-06-11 ENCOUNTER — Other Ambulatory Visit: Payer: Self-pay | Admitting: Obstetrics

## 2022-06-11 ENCOUNTER — Ambulatory Visit (HOSPITAL_BASED_OUTPATIENT_CLINIC_OR_DEPARTMENT_OTHER): Payer: BC Managed Care – PPO | Admitting: *Deleted

## 2022-06-11 ENCOUNTER — Encounter: Payer: Self-pay | Admitting: *Deleted

## 2022-06-11 ENCOUNTER — Ambulatory Visit: Payer: BC Managed Care – PPO | Admitting: *Deleted

## 2022-06-11 ENCOUNTER — Ambulatory Visit: Payer: BC Managed Care – PPO | Attending: Obstetrics

## 2022-06-11 VITALS — BP 125/68 | HR 72

## 2022-06-11 DIAGNOSIS — O99113 Other diseases of the blood and blood-forming organs and certain disorders involving the immune mechanism complicating pregnancy, third trimester: Secondary | ICD-10-CM

## 2022-06-11 DIAGNOSIS — O99213 Obesity complicating pregnancy, third trimester: Secondary | ICD-10-CM

## 2022-06-11 DIAGNOSIS — O365930 Maternal care for other known or suspected poor fetal growth, third trimester, not applicable or unspecified: Secondary | ICD-10-CM

## 2022-06-11 DIAGNOSIS — O365990 Maternal care for other known or suspected poor fetal growth, unspecified trimester, not applicable or unspecified: Secondary | ICD-10-CM | POA: Diagnosis not present

## 2022-06-11 DIAGNOSIS — E669 Obesity, unspecified: Secondary | ICD-10-CM

## 2022-06-11 DIAGNOSIS — Z3A35 35 weeks gestation of pregnancy: Secondary | ICD-10-CM

## 2022-06-11 NOTE — Procedures (Signed)
Stephanie Huerta [birth year] [redacted]w[redacted]d  Fetus A Non-Stress Test Interpretation for 06/11/22  Indication: IUGR and Unsatisfactory BPP  Fetal Heart Rate A Mode: External Baseline Rate (A): 120 bpm Variability: Moderate Accelerations: 15 x 15 Decelerations: None  Uterine Activity Mode: Palpation, Toco Contraction Frequency (min): None  Interpretation (Fetal Testing) Nonstress Test Interpretation: Reactive Comments: Dr. Grace Bushy reviewed tracing.

## 2022-06-16 DIAGNOSIS — Z23 Encounter for immunization: Secondary | ICD-10-CM | POA: Diagnosis not present

## 2022-06-17 DIAGNOSIS — Z3685 Encounter for antenatal screening for Streptococcus B: Secondary | ICD-10-CM | POA: Diagnosis not present

## 2022-06-17 LAB — OB RESULTS CONSOLE GBS: GBS: NEGATIVE

## 2022-06-18 ENCOUNTER — Ambulatory Visit: Payer: BC Managed Care – PPO | Admitting: *Deleted

## 2022-06-18 ENCOUNTER — Ambulatory Visit: Payer: BC Managed Care – PPO | Attending: Obstetrics and Gynecology

## 2022-06-18 VITALS — BP 120/65 | HR 88

## 2022-06-18 DIAGNOSIS — O365930 Maternal care for other known or suspected poor fetal growth, third trimester, not applicable or unspecified: Secondary | ICD-10-CM | POA: Diagnosis not present

## 2022-06-18 DIAGNOSIS — D6851 Activated protein C resistance: Secondary | ICD-10-CM

## 2022-06-18 DIAGNOSIS — O99213 Obesity complicating pregnancy, third trimester: Secondary | ICD-10-CM | POA: Insufficient documentation

## 2022-06-18 DIAGNOSIS — O365990 Maternal care for other known or suspected poor fetal growth, unspecified trimester, not applicable or unspecified: Secondary | ICD-10-CM | POA: Insufficient documentation

## 2022-06-18 DIAGNOSIS — O99113 Other diseases of the blood and blood-forming organs and certain disorders involving the immune mechanism complicating pregnancy, third trimester: Secondary | ICD-10-CM | POA: Diagnosis not present

## 2022-06-18 DIAGNOSIS — E669 Obesity, unspecified: Secondary | ICD-10-CM

## 2022-06-18 DIAGNOSIS — Z3A36 36 weeks gestation of pregnancy: Secondary | ICD-10-CM

## 2022-06-25 ENCOUNTER — Other Ambulatory Visit: Payer: Self-pay | Admitting: Obstetrics

## 2022-06-25 ENCOUNTER — Ambulatory Visit: Payer: BC Managed Care – PPO | Admitting: *Deleted

## 2022-06-25 ENCOUNTER — Ambulatory Visit: Payer: BC Managed Care – PPO | Attending: Obstetrics

## 2022-06-25 VITALS — BP 134/63 | HR 86

## 2022-06-25 DIAGNOSIS — O283 Abnormal ultrasonic finding on antenatal screening of mother: Secondary | ICD-10-CM | POA: Insufficient documentation

## 2022-06-25 DIAGNOSIS — O99119 Other diseases of the blood and blood-forming organs and certain disorders involving the immune mechanism complicating pregnancy, unspecified trimester: Secondary | ICD-10-CM | POA: Diagnosis not present

## 2022-06-25 DIAGNOSIS — O99213 Obesity complicating pregnancy, third trimester: Secondary | ICD-10-CM | POA: Diagnosis not present

## 2022-06-25 DIAGNOSIS — D6851 Activated protein C resistance: Secondary | ICD-10-CM

## 2022-06-25 DIAGNOSIS — Z3A37 37 weeks gestation of pregnancy: Secondary | ICD-10-CM

## 2022-06-25 DIAGNOSIS — E669 Obesity, unspecified: Secondary | ICD-10-CM

## 2022-06-25 DIAGNOSIS — O365990 Maternal care for other known or suspected poor fetal growth, unspecified trimester, not applicable or unspecified: Secondary | ICD-10-CM | POA: Diagnosis not present

## 2022-06-25 DIAGNOSIS — O365930 Maternal care for other known or suspected poor fetal growth, third trimester, not applicable or unspecified: Secondary | ICD-10-CM | POA: Diagnosis not present

## 2022-06-25 NOTE — Procedures (Signed)
Stephanie Huerta [birth year] [redacted]w[redacted]d  Fetus A Non-Stress Test Interpretation for 06/25/22  Indication: Unsatisfactory BPP  Fetal Heart Rate A Mode: External Baseline Rate (A): 130 bpm Variability: Moderate Accelerations: 15 x 15 Decelerations: None Multiple birth?: No  Uterine Activity Mode: Palpation, Toco Contraction Frequency (min): none Resting Tone Palpated: Relaxed  Interpretation (Fetal Testing) Nonstress Test Interpretation: Reactive Overall Impression: Reassuring for gestational age Comments: Dr. Parke Poisson reviewed tracing.

## 2022-06-28 ENCOUNTER — Ambulatory Visit: Payer: BC Managed Care – PPO | Admitting: *Deleted

## 2022-06-28 ENCOUNTER — Encounter: Payer: Self-pay | Admitting: *Deleted

## 2022-06-28 ENCOUNTER — Ambulatory Visit (HOSPITAL_BASED_OUTPATIENT_CLINIC_OR_DEPARTMENT_OTHER): Payer: BC Managed Care – PPO

## 2022-06-28 DIAGNOSIS — O99214 Obesity complicating childbirth: Secondary | ICD-10-CM | POA: Diagnosis not present

## 2022-06-28 DIAGNOSIS — Z3A38 38 weeks gestation of pregnancy: Secondary | ICD-10-CM | POA: Diagnosis not present

## 2022-06-28 DIAGNOSIS — Z3A37 37 weeks gestation of pregnancy: Secondary | ICD-10-CM | POA: Insufficient documentation

## 2022-06-28 DIAGNOSIS — O99213 Obesity complicating pregnancy, third trimester: Secondary | ICD-10-CM | POA: Diagnosis not present

## 2022-06-28 DIAGNOSIS — O365930 Maternal care for other known or suspected poor fetal growth, third trimester, not applicable or unspecified: Secondary | ICD-10-CM | POA: Diagnosis not present

## 2022-06-28 DIAGNOSIS — D6851 Activated protein C resistance: Secondary | ICD-10-CM | POA: Diagnosis not present

## 2022-06-28 DIAGNOSIS — O99113 Other diseases of the blood and blood-forming organs and certain disorders involving the immune mechanism complicating pregnancy, third trimester: Secondary | ICD-10-CM | POA: Insufficient documentation

## 2022-06-28 DIAGNOSIS — O99119 Other diseases of the blood and blood-forming organs and certain disorders involving the immune mechanism complicating pregnancy, unspecified trimester: Secondary | ICD-10-CM

## 2022-06-28 DIAGNOSIS — E282 Polycystic ovarian syndrome: Secondary | ICD-10-CM | POA: Diagnosis not present

## 2022-06-28 DIAGNOSIS — O99284 Endocrine, nutritional and metabolic diseases complicating childbirth: Secondary | ICD-10-CM | POA: Diagnosis not present

## 2022-06-28 DIAGNOSIS — E669 Obesity, unspecified: Secondary | ICD-10-CM

## 2022-06-28 DIAGNOSIS — O365990 Maternal care for other known or suspected poor fetal growth, unspecified trimester, not applicable or unspecified: Secondary | ICD-10-CM | POA: Diagnosis not present

## 2022-06-28 DIAGNOSIS — O9912 Other diseases of the blood and blood-forming organs and certain disorders involving the immune mechanism complicating childbirth: Secondary | ICD-10-CM | POA: Diagnosis not present

## 2022-06-30 ENCOUNTER — Other Ambulatory Visit: Payer: Self-pay

## 2022-06-30 ENCOUNTER — Other Ambulatory Visit: Payer: Self-pay | Admitting: Obstetrics and Gynecology

## 2022-06-30 ENCOUNTER — Telehealth (HOSPITAL_COMMUNITY): Payer: Self-pay | Admitting: *Deleted

## 2022-06-30 DIAGNOSIS — O365910 Maternal care for other known or suspected poor fetal growth, first trimester, not applicable or unspecified: Secondary | ICD-10-CM

## 2022-06-30 NOTE — Telephone Encounter (Signed)
Preadmission screen  

## 2022-07-01 ENCOUNTER — Inpatient Hospital Stay (HOSPITAL_COMMUNITY): Payer: BC Managed Care – PPO

## 2022-07-01 ENCOUNTER — Inpatient Hospital Stay (HOSPITAL_COMMUNITY)
Admission: AD | Admit: 2022-07-01 | Discharge: 2022-07-03 | DRG: 806 | Disposition: A | Discharge status: Home / Self Care | Payer: BC Managed Care – PPO | Attending: Obstetrics and Gynecology | Admitting: Obstetrics and Gynecology

## 2022-07-01 ENCOUNTER — Encounter (HOSPITAL_COMMUNITY): Payer: Self-pay | Admitting: Obstetrics and Gynecology

## 2022-07-01 ENCOUNTER — Inpatient Hospital Stay (HOSPITAL_COMMUNITY): Payer: BC Managed Care – PPO | Admitting: Anesthesiology

## 2022-07-01 DIAGNOSIS — O99284 Endocrine, nutritional and metabolic diseases complicating childbirth: Secondary | ICD-10-CM | POA: Diagnosis present

## 2022-07-01 DIAGNOSIS — D6851 Activated protein C resistance: Secondary | ICD-10-CM | POA: Diagnosis present

## 2022-07-01 DIAGNOSIS — O9912 Other diseases of the blood and blood-forming organs and certain disorders involving the immune mechanism complicating childbirth: Secondary | ICD-10-CM | POA: Diagnosis present

## 2022-07-01 DIAGNOSIS — O99214 Obesity complicating childbirth: Secondary | ICD-10-CM | POA: Diagnosis present

## 2022-07-01 DIAGNOSIS — O365910 Maternal care for other known or suspected poor fetal growth, first trimester, not applicable or unspecified: Secondary | ICD-10-CM

## 2022-07-01 DIAGNOSIS — Z3A38 38 weeks gestation of pregnancy: Secondary | ICD-10-CM

## 2022-07-01 DIAGNOSIS — O365990 Maternal care for other known or suspected poor fetal growth, unspecified trimester, not applicable or unspecified: Secondary | ICD-10-CM | POA: Diagnosis present

## 2022-07-01 DIAGNOSIS — O365930 Maternal care for other known or suspected poor fetal growth, third trimester, not applicable or unspecified: Secondary | ICD-10-CM | POA: Diagnosis present

## 2022-07-01 DIAGNOSIS — E282 Polycystic ovarian syndrome: Secondary | ICD-10-CM | POA: Diagnosis present

## 2022-07-01 LAB — CBC
HCT: 37.2 % (ref 36.0–46.0)
Hemoglobin: 12.2 g/dL (ref 12.0–15.0)
MCH: 30.2 pg (ref 26.0–34.0)
MCHC: 32.8 g/dL (ref 30.0–36.0)
MCV: 92.1 fL (ref 80.0–100.0)
Platelets: 239 10*3/uL (ref 150–400)
RBC: 4.04 MIL/uL (ref 3.87–5.11)
RDW: 13.1 % (ref 11.5–15.5)
WBC: 15.2 10*3/uL — ABNORMAL HIGH (ref 4.0–10.5)
nRBC: 0 % (ref 0.0–0.2)

## 2022-07-01 LAB — TYPE AND SCREEN
ABO/RH(D): A POS
Antibody Screen: NEGATIVE

## 2022-07-01 LAB — CREATININE, SERUM
Creatinine, Ser: 0.61 mg/dL (ref 0.44–1.00)
GFR, Estimated: >60 mL/min (ref >60)

## 2022-07-01 LAB — RPR: RPR Ser Ql: NONREACTIVE

## 2022-07-01 MED ORDER — TETANUS-DIPHTH-ACELL PERTUSSIS 5-2.5-18.5 LF-MCG/0.5 IM SUSY: 0.5000 mL | PREFILLED_SYRINGE | Freq: Once | INTRAMUSCULAR | Status: DC

## 2022-07-01 MED ORDER — WITCH HAZEL-GLYCERIN EX PADS: 1.0000 | MEDICATED_PAD | CUTANEOUS | Status: DC | PRN

## 2022-07-01 MED ORDER — PRENATAL MULTIVITAMIN CH
1.0000 | ORAL_TABLET | Freq: Every day | ORAL | Status: DC
  Administered 2022-07-02: 1 via ORAL
  Filled 2022-07-01: qty 1

## 2022-07-01 MED ORDER — OXYTOCIN BOLUS FROM INFUSION
333.0000 mL | Freq: Once | INTRAVENOUS | Status: AC
  Administered 2022-07-01: 333 mL via INTRAVENOUS

## 2022-07-01 MED ORDER — OXYTOCIN-SODIUM CHLORIDE 30-0.9 UT/500ML-% IV SOLN
1.0000 m[IU]/min | INTRAVENOUS | Status: DC
  Administered 2022-07-01: 2 m[IU]/min via INTRAVENOUS
  Filled 2022-07-01: qty 500

## 2022-07-01 MED ORDER — LACTATED RINGERS IV SOLN: 500.0000 mL | INTRAVENOUS | Status: DC | PRN

## 2022-07-01 MED ORDER — BENZOCAINE-MENTHOL 20-0.5 % EX AERO: 1.0000 | INHALATION_SPRAY | CUTANEOUS | Status: DC | PRN

## 2022-07-01 MED ORDER — DIPHENHYDRAMINE HCL 25 MG PO CAPS: 25.0000 mg | ORAL_CAPSULE | Freq: Four times a day (QID) | ORAL | Status: DC | PRN

## 2022-07-01 MED ORDER — LIDOCAINE HCL (PF) 1 % IJ SOLN
INTRAMUSCULAR | Status: DC | PRN
  Administered 2022-07-01: 4 mL via EPIDURAL
  Administered 2022-07-01: 5 mL via EPIDURAL

## 2022-07-01 MED ORDER — COCONUT OIL OIL: 1.0000 | TOPICAL_OIL | Status: DC | PRN

## 2022-07-01 MED ORDER — OXYTOCIN-SODIUM CHLORIDE 30-0.9 UT/500ML-% IV SOLN
2.5000 [IU]/h | INTRAVENOUS | Status: DC
  Administered 2022-07-01: 2.5 [IU]/h via INTRAVENOUS
  Filled 2022-07-01: qty 500

## 2022-07-01 MED ORDER — TERBUTALINE SULFATE 1 MG/ML IJ SOLN: 0.2500 mg | Freq: Once | INTRAMUSCULAR | Status: DC | PRN

## 2022-07-01 MED ORDER — SENNOSIDES-DOCUSATE SODIUM 8.6-50 MG PO TABS
2.0000 | ORAL_TABLET | Freq: Every day | ORAL | Status: DC
  Administered 2022-07-02 – 2022-07-03 (×2): 2 via ORAL
  Filled 2022-07-01 (×2): qty 2

## 2022-07-01 MED ORDER — EPHEDRINE 5 MG/ML INJ: 10.0000 mg | INTRAVENOUS | Status: DC | PRN

## 2022-07-01 MED ORDER — ONDANSETRON HCL 4 MG/2ML IJ SOLN: 4.0000 mg | INTRAMUSCULAR | Status: DC | PRN

## 2022-07-01 MED ORDER — PHENYLEPHRINE 80 MCG/ML (10ML) SYRINGE FOR IV PUSH (FOR BLOOD PRESSURE SUPPORT)
80.0000 ug | PREFILLED_SYRINGE | INTRAVENOUS | Status: DC | PRN
  Filled 2022-07-01: qty 10

## 2022-07-01 MED ORDER — LIDOCAINE HCL (PF) 1 % IJ SOLN: 30.0000 mL | INTRAMUSCULAR | Status: DC | PRN

## 2022-07-01 MED ORDER — ACETAMINOPHEN 325 MG PO TABS: 650.0000 mg | ORAL_TABLET | ORAL | Status: DC | PRN

## 2022-07-01 MED ORDER — PHENYLEPHRINE 80 MCG/ML (10ML) SYRINGE FOR IV PUSH (FOR BLOOD PRESSURE SUPPORT)
80.0000 ug | PREFILLED_SYRINGE | INTRAVENOUS | Status: DC | PRN
  Administered 2022-07-01: 80 ug via INTRAVENOUS

## 2022-07-01 MED ORDER — DIBUCAINE (PERIANAL) 1 % EX OINT: 1.0000 | TOPICAL_OINTMENT | CUTANEOUS | Status: DC | PRN

## 2022-07-01 MED ORDER — FENTANYL-BUPIVACAINE-NACL 0.5-0.125-0.9 MG/250ML-% EP SOLN
12.0000 mL/h | EPIDURAL | Status: DC | PRN
  Administered 2022-07-01: 12 mL/h via EPIDURAL
  Filled 2022-07-01: qty 250

## 2022-07-01 MED ORDER — FAMOTIDINE 20 MG PO TABS
20.0000 mg | ORAL_TABLET | Freq: Two times a day (BID) | ORAL | Status: DC
  Administered 2022-07-01 – 2022-07-03 (×4): 20 mg via ORAL
  Filled 2022-07-01 (×4): qty 1

## 2022-07-01 MED ORDER — ONDANSETRON HCL 4 MG PO TABS: 4.0000 mg | ORAL_TABLET | ORAL | Status: DC | PRN

## 2022-07-01 MED ORDER — ONDANSETRON HCL 4 MG/2ML IJ SOLN
4.0000 mg | Freq: Four times a day (QID) | INTRAMUSCULAR | Status: DC | PRN
  Administered 2022-07-01: 4 mg via INTRAVENOUS
  Filled 2022-07-01: qty 2

## 2022-07-01 MED ORDER — OXYCODONE-ACETAMINOPHEN 5-325 MG PO TABS: 2.0000 | ORAL_TABLET | ORAL | Status: DC | PRN

## 2022-07-01 MED ORDER — SOD CITRATE-CITRIC ACID 500-334 MG/5ML PO SOLN: 30.0000 mL | ORAL | Status: DC | PRN

## 2022-07-01 MED ORDER — OXYTOCIN-SODIUM CHLORIDE 30-0.9 UT/500ML-% IV SOLN
1.0000 m[IU]/min | INTRAVENOUS | Status: DC
  Administered 2022-07-01: 20 m[IU]/min via INTRAVENOUS

## 2022-07-01 MED ORDER — FENTANYL CITRATE (PF) 100 MCG/2ML IJ SOLN
50.0000 ug | INTRAMUSCULAR | Status: DC | PRN
  Administered 2022-07-01: 100 ug via INTRAVENOUS
  Filled 2022-07-01: qty 2

## 2022-07-01 MED ORDER — ENOXAPARIN SODIUM 60 MG/0.6ML IJ SOSY
60.0000 mg | PREFILLED_SYRINGE | INTRAMUSCULAR | Status: DC
  Administered 2022-07-02 – 2022-07-03 (×2): 60 mg via SUBCUTANEOUS
  Filled 2022-07-01 (×2): qty 0.6

## 2022-07-01 MED ORDER — LACTATED RINGERS IV SOLN: 500.0000 mL | Freq: Once | INTRAVENOUS | Status: DC

## 2022-07-01 MED ORDER — LACTATED RINGERS IV SOLN: INTRAVENOUS | Status: DC

## 2022-07-01 MED ORDER — OXYCODONE-ACETAMINOPHEN 5-325 MG PO TABS: 1.0000 | ORAL_TABLET | ORAL | Status: DC | PRN

## 2022-07-01 MED ORDER — ZOLPIDEM TARTRATE 5 MG PO TABS: 5.0000 mg | ORAL_TABLET | Freq: Every evening | ORAL | Status: DC | PRN

## 2022-07-01 MED ORDER — SIMETHICONE 80 MG PO CHEW: 80.0000 mg | CHEWABLE_TABLET | ORAL | Status: DC | PRN

## 2022-07-01 MED ORDER — IBUPROFEN 600 MG PO TABS
600.0000 mg | ORAL_TABLET | Freq: Four times a day (QID) | ORAL | Status: DC
  Administered 2022-07-01 – 2022-07-03 (×7): 600 mg via ORAL
  Filled 2022-07-01 (×7): qty 1

## 2022-07-01 MED ORDER — DIPHENHYDRAMINE HCL 50 MG/ML IJ SOLN: 12.5000 mg | INTRAMUSCULAR | Status: DC | PRN

## 2022-07-01 NOTE — Progress Notes (Signed)
Patient now s/p epidural. Comfortable, feels intermittent pressure but no strong urge to push  BP 122/64   Pulse 77   Temp 98.6 F (37 C) (Oral)   Resp 18   Ht 5\' 5"  (1.651 m)   Wt 120.5 kg   LMP 10/08/2021   SpO2 98%   BMI 44.20 kg/m  Complete/+1 Pitocin at 22 mU/min, ctx palpate q2-50min  Allow to labor down at this time, close eye on status. Anticipate SVD

## 2022-07-01 NOTE — Plan of Care (Signed)

## 2022-07-01 NOTE — Anesthesia Preprocedure Evaluation (Addendum)
Anesthesia Evaluation  Patient identified by MRN, date of birth, ID band Patient awake    Reviewed: Allergy & Precautions, NPO status , Patient's Chart, lab work & pertinent test results  History of Anesthesia Complications Negative for: history of anesthetic complications  Airway Mallampati: II   Neck ROM: Full    Dental   Pulmonary asthma ,    Pulmonary exam normal        Cardiovascular negative cardio ROS Normal cardiovascular exam     Neuro/Psych negative neurological ROS  negative psych ROS   GI/Hepatic negative GI ROS, Neg liver ROS,   Endo/Other  Morbid obesity  Renal/GU negative Renal ROS     Musculoskeletal negative musculoskeletal ROS (+)   Abdominal (+) + obese,   Peds  Hematology  Heterozygous Factor V Leiden mutation without sequelae    Anesthesia Other Findings   Reproductive/Obstetrics (+) Pregnancy  PCOS                             Anesthesia Physical Anesthesia Plan  ASA: 3  Anesthesia Plan: Epidural   Post-op Pain Management: Minimal or no pain anticipated   Induction:   PONV Risk Score and Plan: 2 and Treatment may vary due to age or medical condition  Airway Management Planned: Natural Airway  Additional Equipment: None  Intra-op Plan:   Post-operative Plan:   Informed Consent: I have reviewed the patients History and Physical, chart, labs and discussed the procedure including the risks, benefits and alternatives for the proposed anesthesia with the patient or authorized representative who has indicated his/her understanding and acceptance.       Plan Discussed with: Anesthesiologist  Anesthesia Plan Comments: (Labs reviewed. Platelets acceptable, patient not taking any blood thinning medications. Per RN, FHR tracing reported to be stable enough for sitting procedure. Risks and benefits discussed with patient, including PDPH, backache, epidural  hematoma, failed epidural, blood pressure changes, allergic reaction, and nerve injury. Patient expressed understanding and wished to proceed.)        Anesthesia Quick Evaluation

## 2022-07-01 NOTE — Progress Notes (Signed)
Cat 1 tracing, AROM with mec stained fluid at 0840, CE 4/70/-2, soft and midposition BP 120/68   Pulse 82   Temp 98.4 F (36.9 C) (Oral)   Resp 18   Ht 5\' 5"  (1.651 m)   Wt 120.5 kg   LMP 10/08/2021   SpO2 100%   BMI 44.20 kg/m W Wants to wait on epidural at this time. Continue to titrate. May require IUPC if difficult to trace contractions 2/2 habitus

## 2022-07-01 NOTE — H&P (Addendum)
Stephanie Huerta is a 31 y.o. female presenting for scheudled induction. +FM, denies VB< LOF, CTX.  PNC c/b 1) IUGR/SGA fetus: NIPT low risk, declined amnio, MFM following afi/BPP weekly. [place] 06/28/22 EFW 9%ile AFI 7.64 Dopplers WNL. Recommend delivery at 38 weeks  2) Hz Factor V Leiden mutation - on baby ASA, s/p heme, plan for Lovenox 60mg  SQ QD for 6wk pp 3) BMI 43 - baseline A1c WNL 4) PCOS - conceive on Mounjaro, early glucola 78 On baby ASA. GBS neg  OB History     Gravida  2   Para  0   Term  0   Preterm  0   AB  1   Living  0      SAB  0   IAB  0   Ectopic  0   Multiple  0   Live Births  0          Past Medical History:  Diagnosis Date   Asthma    Hirsutism    Hyperandrogenemia    Insulin resistance    Joint pain    Obesity    PCOS (polycystic ovarian syndrome)    Right knee injury    Shortness of breath    Torn meniscus    Past Surgical History:  Procedure Laterality Date   dilation and curetage     wisdom teeth extraction  02/2016   Family History: family history includes Anxiety disorder in her father; COPD in her paternal grandmother; Cancer in her maternal grandfather and paternal grandmother; Depression in her father; Diabetes in her paternal grandfather and paternal grandmother; Heart disease in her father; Hyperlipidemia in her maternal grandmother; Hypertension in her maternal grandmother and mother; Kidney disease in her father; Obesity in her father and mother; Sleep apnea in her father; Stroke in her maternal grandmother. Social History:  reports that she has never smoked. She has never been exposed to tobacco smoke. She has never used smokeless tobacco. She reports that she does not currently use alcohol. She reports that she does not use drugs.     Maternal Diabetes: [id number] at 28ws, 3hr WNL Genetic Screening: Normal Maternal Ultrasounds/Referrals: IUGR Fetal Ultrasounds or other Referrals:  Referred to Materal Fetal Medicine   Maternal Substance Abuse:  No Significant Maternal Medications:  None Significant Maternal Lab Results:  Group B Strep negative Number of Prenatal Visits:greater than 3 verified prenatal visits Other Comments:  None  Review of Systems  Constitutional:  Negative for chills and fever.  Respiratory:  Negative for shortness of breath.   Cardiovascular:  Negative for chest pain, palpitations and leg swelling.  Gastrointestinal:  Negative for abdominal pain, nausea and vomiting.  Neurological:  Negative for dizziness, weakness and headaches.  Psychiatric/Behavioral:  Negative for suicidal ideas.    Maternal Medical History:  Reason for admission: Nausea.    Dilation: 3 Effacement (%): 70 Station: -3 Exam by:: Bria Torrence RN Blood pressure (!) 106/58, pulse 83, temperature 97.7 F (36.5 C), temperature source Oral, resp. rate 18, height 5\' 5"  (1.651 m), weight 120.5 kg, last menstrual period 10/08/2021, SpO2 100 %. Exam Physical Exam Constitutional:      General: She is not in acute distress.    Appearance: She is well-developed.  HENT:     Head: Normocephalic and atraumatic.  Eyes:     Pupils: Pupils are equal, round, and reactive to light.  Cardiovascular:     Rate and Rhythm: Normal rate and regular rhythm.     Heart  sounds: No murmur heard.    No gallop.  Abdominal:     Tenderness: There is no abdominal tenderness. There is no guarding or rebound.  Genitourinary:    Vagina: Normal.  Musculoskeletal:        General: Normal range of motion.     Cervical back: Normal range of motion and neck supple.  Skin:    General: Skin is warm and dry.  Neurological:     Mental Status: She is alert and oriented to person, place, and time.     Prenatal labs: ABO, Rh: --/--/A POS (09/21 0030) Antibody: NEG (09/21 0030) Rubella: Immune (03/16 0000) RPR: Nonreactive (03/16 0000)  HBsAg: Negative (03/16 0000)  HIV: Non-reactive (03/16 0000)  GBS: Negative/-- (09/07 0000)   Cat  1 tracing, TOCO q4-68m Assessment/Plan: This is a 31yo G2P0010 @ 64 0/7 by LMP c.w TVUS presenting for scheduled IOL at 38wks for IUGR infant. AFT WNL (weekly BPP.Doppler). GBS neg. CE 3/70/-3, o pitocin for induction. Plan for AROM, may have epidural    Melida Quitter Yamira Papa 07/01/2022, 7:44 AM

## 2022-07-01 NOTE — Anesthesia Procedure Notes (Signed)
Epidural Patient location during procedure: OB Start time: 07/01/2022 11:09 AM End time: 07/01/2022 11:12 AM  Staffing Anesthesiologist: Audry Pili, MD Performed: anesthesiologist   Preanesthetic Checklist Completed: patient identified, IV checked, risks and benefits discussed, monitors and equipment checked, pre-op evaluation and timeout performed  Epidural Patient position: sitting Prep: DuraPrep Patient monitoring: continuous pulse ox and blood pressure Approach: midline Location: L2-L3 Injection technique: LOR saline  Needle:  Needle type: Tuohy  Needle gauge: 17 G Needle length: 9 cm Needle insertion depth: 7 cm Catheter size: 19 Gauge Catheter at skin depth: 12 cm Test dose: negative and Other (1% lidocaine)  Assessment Events: blood not aspirated  Additional Notes Patient identified. Risks including, but not limited to, bleeding, infection, nerve damage, paralysis, inadequate analgesia, blood pressure changes, nausea, vomiting, allergic reaction, postpartum back pain, itching, and headache were discussed. Patient expressed understanding and wished to proceed. Sterile prep and drape, including hand hygiene, mask, and sterile gloves were used. The patient was positioned and the spine was prepped. The skin was anesthetized with lidocaine. No paraesthesia or other complication noted. The patient did not experience any signs of intravascular injection such as tinnitus or metallic taste in mouth, nor signs of intrathecal spread such as rapid motor block. Please see nursing notes for vital signs. The patient tolerated the procedure well.   Renold Don, MDReason for block:procedure for pain

## 2022-07-02 ENCOUNTER — Ambulatory Visit: Payer: BC Managed Care – PPO

## 2022-07-02 LAB — CBC
HCT: 32 % — ABNORMAL LOW (ref 36.0–46.0)
Hemoglobin: 10.8 g/dL — ABNORMAL LOW (ref 12.0–15.0)
MCH: 30.9 pg (ref 26.0–34.0)
MCHC: 33.8 g/dL (ref 30.0–36.0)
MCV: 91.4 fL (ref 80.0–100.0)
Platelets: 227 10*3/uL (ref 150–400)
RBC: 3.5 MIL/uL — ABNORMAL LOW (ref 3.87–5.11)
RDW: 13.2 % (ref 11.5–15.5)
WBC: 17.5 10*3/uL — ABNORMAL HIGH (ref 4.0–10.5)
nRBC: 0 % (ref 0.0–0.2)

## 2022-07-02 NOTE — Progress Notes (Signed)
Post Partum Day 1 Subjective: no complaints, up ad lib, voiding, and tolerating PO  Objective: Blood pressure 120/61, pulse 86, temperature 98.9 F (37.2 C), temperature source Oral, resp. rate 17, height 5\' 5"  (1.651 m), weight 120.5 kg, last menstrual period 10/08/2021, SpO2 99 %, unknown if currently breastfeeding.  Physical Exam:  General: alert, cooperative, and appears stated age Lochia: appropriate Uterine Fundus: firm DVT Evaluation: No evidence of DVT seen on physical exam.  Recent Labs    07/01/22 0034 07/02/22 0531  HGB 12.2 10.8*  HCT 37.2 32.0*    Assessment/Plan: Breastfeeding Continue routine PP care   LOS: 1 day   Vanessa Kick, MD 07/02/2022, 10:50 AM

## 2022-07-02 NOTE — Anesthesia Postprocedure Evaluation (Signed)
Anesthesia Post Note  Patient: Stephanie Huerta  Procedure(s) Performed: AN AD [place]     Patient location during evaluation: Mother Baby Anesthesia Type: Epidural Level of consciousness: awake and alert Pain management: pain level controlled Vital Signs Assessment: post-procedure vital signs reviewed and stable Respiratory status: spontaneous breathing, nonlabored ventilation and respiratory function stable Cardiovascular status: stable Postop Assessment: no headache, no backache, epidural receding, no apparent nausea or vomiting, patient able to bend at knees, able to ambulate and adequate PO intake Anesthetic complications: no   No notable events documented.  Last Vitals:  Vitals:   07/02/22 0012 07/02/22 0407  BP: 127/68 120/61  Pulse: 95 86  Resp: 17 17  Temp: 36.9 C 37.2 C  SpO2: 98% 99%    Last Pain:  Vitals:   07/02/22 0744  TempSrc:   PainSc: 1    Pain Goal:                   [name]

## 2022-07-02 NOTE — Lactation Note (Signed)
This note was copied from a baby's chart. Lactation Consultation Note  Patient Name: Stephanie Huerta Today's Date: 07/02/2022 Reason for consult: Initial assessment;Early term 37-38.6wks;Primapara;1st time breastfeeding Age:29 hours   P1: Early term infant at 38+0 weeks Feeding preference: Breast Weight loss: 5%  "Stephanie Huerta" was beginning to awaken and show cues when I arrived.  Offered to assist with latching; birth parent receptive.  Taught hand expression and birth parent able to easily express colostrum; finger fed drops to "Stephanie Huerta."  Attempted to latch, however, she was not interested in opening her mouth for feeding.  Encouraged lots of STS, hand expression and to feed back any drops obtained via finger/spoon.  Placed her STS on birth parent's chest and she fell asleep. Infant has voided/stooled.  Offered to return as needed for latch assistance.  Birth parent appreciative and will feed at least every three hours due to gestational age and sooner if "Stephanie Huerta" shows cues.  Support person present and asleep on the couch.   Maternal Data Has patient been taught Hand Expression?: Yes Does the patient have breastfeeding experience prior to this delivery?: No  Feeding Mother's Current Feeding Choice: Breast Milk  LATCH Score Latch: Too sleepy or reluctant, no latch achieved, no sucking elicited.  Audible Swallowing: None  Type of Nipple: Everted at rest and after stimulation  Comfort (Breast/Nipple): Soft / non-tender  Hold (Positioning): Assistance needed to correctly position infant at breast and maintain latch.  LATCH Score: 5   Lactation Tools Discussed/Used    Interventions Interventions: Breast feeding basics reviewed;Assisted with latch;Skin to skin;Breast massage;Hand express;Adjust position;Support pillows;Position options;Expressed milk;Education;LC Services brochure  Discharge Pump: Personal  Consult Status Consult Status: Follow-up Date: 07/03/22 Follow-up type:  In-patient    Little Ishikawa 07/02/2022, 9:44 AM

## 2022-07-02 NOTE — Lactation Note (Signed)
This note was copied from a baby's chart. Lactation Consultation Note  Patient Name: Girl Bernyce Brimley [id number] Date: 07/02/2022   Age:29 hours   LC Note:  Attempted to visit with family, however, birth parent is asleep at this time.  Will follow up later today.   Maternal Data    Feeding    LATCH Score                    Lactation Tools Discussed/Used    Interventions    Discharge    Consult Status      Dalonte Hardage R Latrena Benegas 07/02/2022, 8:03 AM

## 2022-07-03 MED ORDER — ENOXAPARIN SODIUM 60 MG/0.6ML IJ SOSY: 60.0000 mg | PREFILLED_SYRINGE | INTRAMUSCULAR | 0 refills | Status: AC

## 2022-07-03 NOTE — Lactation Note (Signed)
This note was copied from a baby's chart. Lactation Consultation Note  Patient Name: Stephanie Huerta [id number] Date: 07/03/2022 Reason for consult: Follow-up assessment;1st time breastfeeding;Primapara;Early term 37-38.6wks;Infant weight loss Age:29 hours  LC in to visit with P1 Mom of ET infant on day of scheduled discharge.  Baby is at a 9.1% weight loss and bilirubin is trending up, so Ped wants baby to stay IP for another day.  Parents are good with this.  Baby is sleepy at the breast, feeds actively for a couple minutes before needing stimulation.  Baby is being supplemented after each breastfeeding, volume as tolerated by slow flow nipple and paced bottle.   Pump set up and Mom has pumped twice already.  Syringe with 4 ml at bedside while baby feeding formula.  Recommended feeding EBM first before formula.  Assisted with syringe feeding baby while she tolerated well.  Plan recommended- 1- STS as much as possible with baby 2- Offer breast with feeding cues, or awaken if baby is asleep 3 hrs 3-Supplement baby with EBM+/formula by paced bottle (as much as baby wants per Peds) 4-Pump both breasts on initiation setting  5- Ask for help with latch and positioning prn.    Lactation Tools Discussed/Used Tools: Pump;Flanges;Bottle Flange Size: 24 Breast pump type: Double-Electric Breast Pump Pump Education: Setup, frequency, and cleaning;Milk Storage Reason for Pumping: Support milk supply/weight loss/hyperbilirubinemia Pumped volume: 4 mL  Interventions Interventions: Breast feeding basics reviewed;Assisted with latch;Skin to skin;Breast massage;Hand express;DEBP;Pace feeding  Discharge Pump: Personal;DEBP (Spectra DEBP)  Consult Status Consult Status: Follow-up Date: 07/04/22 Follow-up type: In-patient    Broadus John 07/03/2022, 10:39 AM

## 2022-07-03 NOTE — Discharge Summary (Signed)
Postpartum Discharge Summary  Patient Name: Stephanie Huerta DOB: 03/13/1991 MRN: [id number]  Date of admission: 07/01/2022 Delivery date:07/01/2022  Delivering provider: Carlisle Cater  Date of discharge: 07/03/2022  Admitting diagnosis: IUGR (intrauterine growth restriction) affecting care of mother [O36.5990] Intrauterine pregnancy: [redacted]w[redacted]d     Secondary diagnosis:  Principal Problem:   IUGR (intrauterine growth restriction) affecting care of mother  Additional problems: Factor V Leiden, PCOS, morbid obesity    Discharge diagnosis: Term Pregnancy Delivered                                              Post partum procedures: none Augmentation: AROM and Pitocin Complications: None  Hospital course: Induction of Labor With Vaginal Delivery   31 y.o. yo G2P1011 at [redacted]w[redacted]d was admitted to the hospital 07/01/2022 for induction of labor.  Indication for induction:  IUGR .  Patient had an uncomplicated labor course as follows: Membrane Rupture Time/Date: 8:35 AM ,07/01/2022   Delivery Method:Vaginal, Spontaneous  Episiotomy: None  Lacerations:  Labial;Vaginal  Details of delivery can be found in separate delivery note.  Patient had a routine postpartum course. Patient is discharged home 07/03/22.  Newborn Data: Birth date:07/01/2022  Birth time:1:44 PM  Gender:Female  Living status:Living  Apgars:8 ,9  Weight:3200 g   Magnesium Sulfate received: No BMZ received: No Rhophylac:N/A Transfusion:No  Physical exam  Vitals:   07/02/22 0407 07/02/22 1445 07/02/22 2107 07/03/22 0533  BP: 120/61 133/78 111/65 128/73  Pulse: 86 90 91 76  Resp: 17 16 18 18   Temp: 98.9 F (37.2 C) 97.7 F (36.5 C) 98.2 F (36.8 C) 97.9 F (36.6 C)  TempSrc: Oral Oral Oral Oral  SpO2: 99% 97% 98% 99%  Weight:      Height:       General: alert, cooperative, and no distress Lochia: appropriate Uterine Fundus: firm Incision: N/A DVT Evaluation: No evidence of DVT seen on physical  exam. Labs: Lab Results  Component Value Date   WBC 17.5 (H) 07/02/2022   HGB 10.8 (L) 07/02/2022   HCT 32.0 (L) 07/02/2022   MCV 91.4 07/02/2022   PLT 227 07/02/2022      Latest Ref Rng & Units 07/01/2022   12:34 AM  CMP  Creatinine 0.44 - 1.00 mg/dL 07/03/2022    Edinburgh Score:    07/01/2022    3:25 PM  Edinburgh Postnatal Depression Scale Screening Tool  I have been able to laugh and see the funny side of things. 1  I have looked forward with enjoyment to things. 1  I have blamed myself unnecessarily when things went wrong. 2  I have been anxious or worried for no good reason. 1  I have felt scared or panicky for no good reason. 0  Things have been getting on top of me. 1  I have been so unhappy that I have had difficulty sleeping. 0  I have felt sad or miserable. 0  I have been so unhappy that I have been crying. 0  The thought of harming myself has occurred to me. 0  Edinburgh Postnatal Depression Scale Total 6      After visit meds:  Allergies as of 07/03/2022   No Known Allergies      Medication List     STOP taking these medications    aspirin EC 81 MG tablet  famotidine 20 MG tablet Commonly known as: PEPCID   metFORMIN 1000 MG tablet Commonly known as: GLUCOPHAGE   PRENATAL VITAMIN PO   Vitamin D 50 MCG (2000 UT) Caps       TAKE these medications    enoxaparin 60 MG/0.6ML injection Commonly known as: LOVENOX Inject 0.6 mLs (60 mg total) into the skin daily. Start taking on: July 04, 2022         Discharge home in stable condition Infant Disposition:home with mother (will remain together one add'l day in hospital) Discharge instruction: per After Visit Summary and Postpartum booklet. Activity: Advance as tolerated. Pelvic rest for 6 weeks.  Diet: routine diet Anticipated Birth Control: Unsure Postpartum Appointment:4 weeks Additional Postpartum F/U: Postpartum Depression checkup Future Appointments: Future Appointments  Date  Time Provider [place]  08/16/2022 12:00 PM Laqueta Linden, MD [facility] None   Follow up Visit:  [place], [facility] Follow up.   Why: Call office to schedule postpartum visit Contact information: [address] [place] [id number] [phone]                     07/03/2022 Allyn Kenner, DO

## 2022-07-04 ENCOUNTER — Ambulatory Visit: Payer: Self-pay

## 2022-07-04 NOTE — Lactation Note (Signed)
This note was copied from a baby's chart. Lactation Consultation Note  Patient Name: Stephanie Huerta [id number] Date: 07/04/2022 Reason for consult: Follow-up assessment Age:29 hours   P1: Early term infant at 38+0 weeks Feeding preference: Breast/formula Weight loss: 9% (morning weight not yet obtained)  Birth parent requested latch assistance.  Assisted baby to latch easily in the cross cradle hold.  With encouragement, she began to breast feed and multiple swallows heard.  Birth parent able to hear swallowing also.  Continued to observe feeding and demonstrated breast compressions for 13 minutes prior to leaving the room.  Parents are also supplementing with formula as needed after feedings.  Birth parent is pumping approximately 10 mls/session.    Asked birth parent to call for a baby weight after this feeding.  Bilirubin level 11.3 mg/dl at 41 hours of life.  Support person present and assisting.   Maternal Data    Feeding    LATCH Score Latch: Grasps breast easily, tongue down, lips flanged, rhythmical sucking.  Audible Swallowing: Spontaneous and intermittent  Type of Nipple: Everted at rest and after stimulation  Comfort (Breast/Nipple): Soft / non-tender  Hold (Positioning): Assistance needed to correctly position infant at breast and maintain latch.  LATCH Score: 9   Lactation Tools Discussed/Used    Interventions    Discharge Discharge Education: Engorgement and breast care Pump: Personal (Spectra)  Consult Status Consult Status: Complete Date: 07/04/22 Follow-up type: Call as needed    Kioni Stahl R Norris Brumbach 07/04/2022, 4:56 AM

## 2022-07-05 ENCOUNTER — Other Ambulatory Visit (HOSPITAL_COMMUNITY): Payer: Self-pay

## 2022-07-05 DIAGNOSIS — R300 Dysuria: Secondary | ICD-10-CM | POA: Diagnosis not present

## 2022-07-05 DIAGNOSIS — N898 Other specified noninflammatory disorders of vagina: Secondary | ICD-10-CM | POA: Diagnosis not present

## 2022-07-05 LAB — SURGICAL PATHOLOGY

## 2022-07-05 MED ORDER — ENOXAPARIN SODIUM 60 MG/0.6ML IJ SOSY
60.0000 mg | PREFILLED_SYRINGE | Freq: Every day | INTRAMUSCULAR | 1 refills | Status: AC
  Filled 2022-07-05: qty 18, 30d supply, fill #0

## 2022-07-05 MED ORDER — METRONIDAZOLE 500 MG PO TABS
500.0000 mg | ORAL_TABLET | Freq: Two times a day (BID) | ORAL | 0 refills | Status: DC
  Filled 2022-07-05: qty 14, 7d supply, fill #0

## 2022-07-06 DIAGNOSIS — R300 Dysuria: Secondary | ICD-10-CM | POA: Diagnosis not present

## 2022-07-10 ENCOUNTER — Telehealth (HOSPITAL_COMMUNITY): Payer: Self-pay | Admitting: *Deleted

## 2022-07-10 NOTE — Telephone Encounter (Signed)
Attempted hospital discharge follow-up call. Left message for patient to return RN call with any questions or concerns. Erline Levine, RN, 07/10/22, [phone]

## 2022-07-22 ENCOUNTER — Other Ambulatory Visit (HOSPITAL_COMMUNITY): Payer: Self-pay

## 2022-07-22 DIAGNOSIS — F419 Anxiety disorder, unspecified: Secondary | ICD-10-CM | POA: Diagnosis not present

## 2022-07-22 MED ORDER — ESCITALOPRAM OXALATE 5 MG PO TABS
5.0000 mg | ORAL_TABLET | Freq: Every day | ORAL | 2 refills | Status: AC
  Filled 2022-07-22: qty 30, 30d supply, fill #0
  Filled 2022-08-12: qty 30, 30d supply, fill #1

## 2022-08-06 DIAGNOSIS — Z3009 Encounter for other general counseling and advice on contraception: Secondary | ICD-10-CM | POA: Diagnosis not present

## 2022-08-06 DIAGNOSIS — Z30430 Encounter for insertion of intrauterine contraceptive device: Secondary | ICD-10-CM | POA: Diagnosis not present

## 2022-08-06 DIAGNOSIS — Z1389 Encounter for screening for other disorder: Secondary | ICD-10-CM | POA: Diagnosis not present

## 2022-08-12 ENCOUNTER — Other Ambulatory Visit (HOSPITAL_COMMUNITY): Payer: Self-pay

## 2022-08-13 ENCOUNTER — Other Ambulatory Visit (HOSPITAL_COMMUNITY): Payer: Self-pay

## 2022-08-16 ENCOUNTER — Other Ambulatory Visit (HOSPITAL_COMMUNITY): Payer: Self-pay

## 2022-08-16 ENCOUNTER — Ambulatory Visit (INDEPENDENT_AMBULATORY_CARE_PROVIDER_SITE_OTHER): Payer: BC Managed Care – PPO | Admitting: Family Medicine

## 2022-08-17 ENCOUNTER — Other Ambulatory Visit (HOSPITAL_COMMUNITY): Payer: Self-pay
